# Patient Record
Sex: Female | Born: 1970
Health system: Southern US, Community
[De-identification: ages and names within clinical notes are randomized; demographics above are authoritative.]

## PROBLEM LIST (undated history)

## (undated) DIAGNOSIS — K219 Gastro-esophageal reflux disease without esophagitis: Secondary | ICD-10-CM

## (undated) HISTORY — DX: Gastro-esophageal reflux disease without esophagitis: K21.9

---

## 1999-07-19 ENCOUNTER — Encounter: Payer: Self-pay | Admitting: Obstetrics and Gynecology

## 1999-07-19 ENCOUNTER — Encounter: Admission: RE | Admit: 1999-07-19 | Discharge: 1999-07-19 | Payer: Self-pay | Admitting: Obstetrics and Gynecology

## 2001-09-06 ENCOUNTER — Other Ambulatory Visit: Admission: RE | Admit: 2001-09-06 | Discharge: 2001-09-06 | Payer: Self-pay | Admitting: Obstetrics and Gynecology

## 2005-06-16 HISTORY — PX: APPENDECTOMY: SHX54

## 2005-09-21 ENCOUNTER — Observation Stay (HOSPITAL_COMMUNITY): Admission: EM | Admit: 2005-09-21 | Discharge: 2005-09-22 | Payer: Self-pay | Admitting: Emergency Medicine

## 2005-09-21 ENCOUNTER — Encounter (INDEPENDENT_AMBULATORY_CARE_PROVIDER_SITE_OTHER): Payer: Self-pay | Admitting: *Deleted

## 2006-09-25 ENCOUNTER — Encounter: Admission: RE | Admit: 2006-09-25 | Discharge: 2006-09-25 | Payer: Self-pay | Admitting: Obstetrics and Gynecology

## 2009-07-31 ENCOUNTER — Encounter: Admission: RE | Admit: 2009-07-31 | Discharge: 2009-07-31 | Payer: Self-pay | Admitting: Obstetrics and Gynecology

## 2010-07-06 ENCOUNTER — Other Ambulatory Visit: Payer: Self-pay | Admitting: Obstetrics and Gynecology

## 2010-07-06 DIAGNOSIS — Z1239 Encounter for other screening for malignant neoplasm of breast: Secondary | ICD-10-CM

## 2010-08-01 ENCOUNTER — Ambulatory Visit
Admission: RE | Admit: 2010-08-01 | Discharge: 2010-08-01 | Disposition: A | Discharge status: Home / Self Care | Payer: Self-pay | Source: Ambulatory Visit | Attending: Obstetrics and Gynecology | Admitting: Obstetrics and Gynecology

## 2010-08-01 DIAGNOSIS — Z1239 Encounter for other screening for malignant neoplasm of breast: Secondary | ICD-10-CM

## 2010-11-01 NOTE — H&P (Signed)
Jo, Cohen                ACCOUNT NO.:  [id number]   MEDICAL RECORD NO.:  [id number]          PATIENT TYPE:  INP   LOCATION:  1825                         FACILITY:  [facility]   PHYSICIAN:  Sandria Bales. Ezzard Standing, M.D.  DATE OF BIRTH:  1970-07-27   DATE OF ADMISSION:  09/20/2005  DATE OF DISCHARGE:                                HISTORY & PHYSICAL   HISTORY OF PRESENT ILLNESS:  Jo Cohen is a 40 year old white female who is  a patient of Dr. Hilbert Bible who started having some abdominal burning on  Thursday,  September 18, 2005.  She then developed nausea and vomiting, and pain  that localized to her right lower quadrant.  She came to [facility]  emergency room last evening.  A CT scan done showed some edema of her  appendix which was suggested of early appendicitis.   Interestingly, Jo Cohen had a similar episode in December of 2006 where  she went to [facility].  At that time she was told she had an elevated white  blood count; however, CT scan done without contrast was negative, and she  went home and got better without any further symptoms until this episode.   She denies any history of peptic ulcer disease, liver disease, pancreatic  disease, or bowel disease.  Her only prior abdominal surgery was a C-  section.   ALLERGIES:  NO KNOWN DRUG ALLERGIES.   MEDICATIONS:  Birth control pills.   REVIEW OF SYSTEMS:  NEUROLOGIC:  No seizures or loss of consciousness.  PULMONARY:  She does not smoke cigarettes.  No history of  pneumonia,  tuberculosis.  CARDIAC:  No history of heart disease, chest pain, or hypertension.  GASTROINTESTINAL:  No history of peptic ulcer disease.  See history of  present illness.  UROLOGIC:  No history of kidney infections.   SOCIAL HISTORY:  She has her fiance in the room with her.  She is single,  but again, engaged to be married.  She has, I think, at least two children.  She is a Designer, industrial/product at [employer].   PHYSICAL  EXAMINATION:  VITAL SIGNS:  Temperature 97.2, blood pressure  121/77, pulse 92, respirations 18.  GENERAL:  She is well-nourished white female, alert and cooperative.  HEENT:  Unremarkable.  NECK:  Supple.  I feel no mass, no thyromegaly.  LUNGS:  Clear to auscultation.  HEART:  Regular rate and rhythm.  I hear no murmur or rub.  ABDOMEN:  She is mildly sore to moderately sore in her right lower quadrant.  She really has no peritoneal signs such as rebound or guarding.  EXTREMITIES:  She has good strength in all four extremities.  NEUROLOGIC:  Grossly intact.   LABORATORY DATA:  White blood count 6900, hemoglobin 12, hematocrit 35,  neutrophils 66%.  Sodium 140, potassium 3.9, chloride 108, CO2 29.  Her  urinalysis was negative.   A review of the CT shows what looks like maybe a thickened appendix.  This  thing is sort of presacral.  It actually probably lays towards the right  retroperitoneum.  She has no abscess.  She also may have the suggestion of  either a gallstone, polyp, or stone.   IMPRESSION:  1.  Probable appendicitis which is possibly recurrent.  Discussed with the      patient and her fiance appendectomy.  We discussed the indications and      potential complications.  The potential complications include but are      not limited to bleeding, infection, bowel injury, and the possibility of      open surgery.  2.  Possible gallstone on computed tomography scan.      Sandria Bales. Ezzard Standing, M.D.  Electronically Signed     DHN/MEDQ  D:  09/21/2005  T:  09/21/2005  Job:  [id number]   cc:   Malachi Pro. Ambrose Mantle, M.D.  Fax: [phone]

## 2010-11-01 NOTE — Op Note (Signed)
[name], Jo Cohen                ACCOUNT NO.:  [id number]   MEDICAL RECORD NO.:  [id number]          PATIENT TYPE:  INP   LOCATION:  2550                         FACILITY:  [facility]   PHYSICIAN:  Sandria Bales. Ezzard Standing, M.D.  DATE OF BIRTH:  [birth year]   DATE OF PROCEDURE:  09/21/2005  DATE OF DISCHARGE:                                 OPERATIVE REPORT   PREOPERATIVE DIAGNOSIS:  Appendicitis.   POSTOPERATIVE DIAGNOSIS:  Appendicitis.   PROCEDURE:  Laparoscopic appendectomy.   SURGEON:  Sandria Bales. Ezzard Standing, M.D.   FIRST ASSISTANT:  None.   ANESTHESIA:  General endotracheal with approximately 20 mL of 0.25%  Marcaine.   COMPLICATIONS:  None.   INDICATION FOR PROCEDURE:  [name] is a 40 year old white female who has  had a 2-plus-day history of abdominal burning, nausea and vomiting with pain  localized to the right lower quadrant.  A CT scan has suggested early  appendicitis.  She now comes for attempted laparoscopic appendectomy.   The indications and potential complications were explained to the patient  and her fiance, who is with her.  Her potential complications include, but  are not limited to, bleeding, infection, bowel injury and the need for open  surgery.   OPERATIVE NOTE:  The patient was placed in a supine position, given a  general anesthetic as supervised by Dr. Kipp Brood.  Her left arm was  tucked by her side, her right arm out.  Foley catheter was then placed.  Her  abdomen was prepped with Betadine solution and sterilely draped.   An infraumbilical incision was made with sharp dissection and carried down  to the abdominal cavity.  A 0-degree 12-mm Hasson trocar was inserted into  the abdominal cavity and secured with a 0 Vicryl suture.  I placed a 10-mm 0-  degree laparoscope and did a laparoscopic exploration.  The right and left  lobes of the liver were unremarkable.  Anterior wall of the stomach was  unremarkable.  Gallbladder was unremarkable.  I visualized  both the right  and left ovaries and tubes and they were unremarkable, but she did have  along her pelvic brim on the right lower quadrant an inflamed-looking  appendix.   The appendix was grabbed and mesentery taken down with a harmonic scalpel  down to the base of the appendix.  I then used the vascular Endo GIA 30-mm  stapler to fire across the base of the appendix and then placed the appendix  in the EndoCatch bag and delivered it through the umbilicus.   I reinspected the appendiceal stump, where it looked like the stapler had  worked well.  She had minimal contamination from the appendix.  There was no  other inflammatory mass.   Each trocar was then removed in turn with the umbilical trocar closed with a  0 Vicryl.  The skin at each site was closed with a 5-0 Vicryl suture,  painted with tincture of Benzoin and steri-stripped.   The patient tolerated the procedure well and was transported to the recovery  room in good condition.  Sponge and needle  counts were correct at the end of  the case.      Sandria Bales. Ezzard Standing, M.D.  Electronically Signed     DHN/MEDQ  D:  09/21/2005  T:  09/22/2005  Job:  [id number]   cc:   Malachi Pro. Ambrose Mantle, M.D.  Fax: [phone]

## 2011-07-21 ENCOUNTER — Other Ambulatory Visit: Payer: Self-pay | Admitting: Obstetrics and Gynecology

## 2011-07-21 DIAGNOSIS — Z1231 Encounter for screening mammogram for malignant neoplasm of breast: Secondary | ICD-10-CM

## 2011-08-04 ENCOUNTER — Ambulatory Visit: Payer: 59

## 2011-08-05 ENCOUNTER — Ambulatory Visit
Admission: RE | Admit: 2011-08-05 | Discharge: 2011-08-05 | Disposition: A | Discharge status: Home / Self Care | Payer: 59 | Source: Ambulatory Visit | Attending: Obstetrics and Gynecology | Admitting: Obstetrics and Gynecology

## 2011-08-05 DIAGNOSIS — Z1231 Encounter for screening mammogram for malignant neoplasm of breast: Secondary | ICD-10-CM

## 2011-09-22 ENCOUNTER — Telehealth (INDEPENDENT_AMBULATORY_CARE_PROVIDER_SITE_OTHER): Payer: Self-pay | Admitting: General Surgery

## 2011-09-22 NOTE — Telephone Encounter (Signed)
Pt reports she is pt of Dr. Ezzard Standing, and is pretty sure she has an anal fissure.  Her GYN told her to call for appt.  She has had pain and bleeding with BMs all weekend.  No appts until May.  Can she be worked into Dr. Allene Pyo schedule?  Please let her know.

## 2011-09-23 ENCOUNTER — Encounter (INDEPENDENT_AMBULATORY_CARE_PROVIDER_SITE_OTHER): Payer: Self-pay | Admitting: General Surgery

## 2011-09-23 ENCOUNTER — Ambulatory Visit (INDEPENDENT_AMBULATORY_CARE_PROVIDER_SITE_OTHER): Payer: 59 | Admitting: General Surgery

## 2011-09-23 VITALS — BP 96/64 | HR 82 | Temp 97.8°F | Resp 18 | Ht 66.0 in | Wt 130.5 lb

## 2011-09-23 DIAGNOSIS — K645 Perianal venous thrombosis: Secondary | ICD-10-CM

## 2011-09-23 NOTE — Progress Notes (Signed)
Patient ID: Jo Cohen, female   DOB: 1970-11-12, 41 y.o.   MRN: [id number]  Chief Complaint  Patient presents with  . Follow-up    Possible hemorrhoids    HPI Jo Cohen is a 41 y.o. female.  She is referred by Dr. Tracey [name] for rectal problems.  The patient has not had any rectal problems in the past. For the past 5 or 6 days she has noticed a painful swelling in the perianal area. Last week she looked in the mirror and saw a purple grape sized swelling. She says this has gotten much better. She sees a little bit of bleeding but she has a bowel movement. She denies any recent constipation or diarrhea. She says it is not painful to have a bowel movement. She's had no prior problems. HPI  Past Medical History  Diagnosis Date  . Hemorrhoids     Past Surgical History  Procedure Date  . Cesarean section 1998  . Appendectomy 2007    History reviewed. No pertinent family history.  Social History History  Substance Use Topics  . Smoking status: Never Smoker   . Smokeless tobacco: Never Used  . Alcohol Use: Yes     1 or 2 per month    No Known Allergies  Current Outpatient Prescriptions  Medication Sig Dispense Refill  . Calcium Carbonate-Vitamin D (CALCIUM + D PO) Take by mouth daily.      . fish oil-omega-3 fatty acids 1000 MG capsule Take 2 g by mouth daily.      . Multiple Vitamin (MULTIVITAMIN) tablet Take 1 tablet by mouth daily.        Review of Systems Review of Systems  Constitutional: Negative.  Negative for fever, chills and unexpected weight change.  HENT: Negative.  Negative for hearing loss, congestion, sore throat, trouble swallowing and voice change.   Eyes: Negative for visual disturbance.  Respiratory: Negative for cough and wheezing.   Cardiovascular: Negative.  Negative for chest pain, palpitations and leg swelling.  Gastrointestinal: Negative.  Negative for nausea, vomiting, abdominal pain, diarrhea, constipation, blood in stool, abdominal  distention and anal bleeding.  Genitourinary: Negative.  Negative for hematuria, vaginal bleeding and difficulty urinating.  Musculoskeletal: Negative for arthralgias.  Skin: Negative for rash and wound.  Neurological: Negative.  Negative for seizures, syncope and headaches.  Hematological: Negative.  Negative for adenopathy. Does not bruise/bleed easily.  Psychiatric/Behavioral: Negative.  Negative for confusion.    Blood pressure 96/64, pulse 82, temperature 97.8 F (36.6 C), temperature source Temporal, resp. rate 18, height 5\' 6"  (1.676 m), weight 130 lb 8 oz (59.194 kg).  Physical Exam Physical Exam  Constitutional: She is oriented to person, place, and time. She appears well-developed and well-nourished. No distress.  Abdominal: Soft. Bowel sounds are normal. She exhibits no distension and no mass. There is no tenderness. There is no rebound and no guarding.  Genitourinary:       She has a tiny thrombosed external hemorrhoid posteriorly. There was a slightly tiny slit of open tissue inside but not much pain. No infection. No fistula. Digital exam reveals almost no pain normal sphincter tone no internal mass. No blood.  Musculoskeletal: Normal range of motion. She exhibits no edema.  Neurological: She is alert and oriented to person, place, and time. She exhibits normal muscle tone. Coordination normal.  Skin: Skin is warm and dry. No rash noted. She is not diaphoretic. No erythema. No pallor.  Psychiatric: She has a normal mood and affect.  Her behavior is normal. Judgment and thought content normal.    Data Reviewed   Assessment    Thrombosed external hemorrhoid, posterior, rapidly resolving. No surgical intervention necessary.  Question associated acute fissure posteriorly. Anticipate spontaneous healing with conservative care.    Plan    Forced hydration, high fiber diet, Metamucil b.i.d.  Analpram-HC 2.5% cream, prescription given. Using internally and externally  b.i.d.  Patient instructed that symptoms should markedly improve within a week or 2 and should resolve within one month.  Return p.r.n.       Angelia Mould. Derrell Lolling, M.D., [facility] General and Minimally invasive Surgery Breast and Colorectal Surgery Office:   [phone] Pager:   [phone]  09/23/2011, 3:37 PM

## 2011-09-23 NOTE — Patient Instructions (Signed)
You have a small thrombosed external hemorrhoid that is resolving on its own. I do not think that you will need surgery for this. There also may be a tiny anal fissure.  I advise a high fiber, low fat diet, a dose of Metamucil twice a day, and forced hydration to keep your stools soft.  Used the steroid cream that I gave you twice a day and put a little bit inside and a little bit on the outside.  You should feel much better in 2 weeks and you should have no symptoms in one month.  Return to see [place] if any further problems arise.

## 2011-10-27 ENCOUNTER — Encounter: Payer: Self-pay | Admitting: Family Medicine

## 2011-10-27 ENCOUNTER — Ambulatory Visit (INDEPENDENT_AMBULATORY_CARE_PROVIDER_SITE_OTHER): Payer: 59 | Admitting: Family Medicine

## 2011-10-27 VITALS — BP 110/72 | HR 86 | Temp 98.4°F | Ht 66.0 in | Wt 133.8 lb

## 2011-10-27 DIAGNOSIS — R5383 Other fatigue: Secondary | ICD-10-CM

## 2011-10-27 DIAGNOSIS — R5381 Other malaise: Secondary | ICD-10-CM

## 2011-10-27 DIAGNOSIS — Z0000 Encounter for general adult medical examination without abnormal findings: Secondary | ICD-10-CM

## 2011-10-27 LAB — CBC WITH DIFFERENTIAL/PLATELET
Basophils Absolute: 0.1 10*3/uL (ref 0.0–0.1)
Basophils Relative: 1.3 % (ref 0.0–3.0)
Eosinophils Absolute: 0.2 10*3/uL (ref 0.0–0.7)
Eosinophils Relative: 3.9 % (ref 0.0–5.0)
HCT: 40.8 % (ref 36.0–46.0)
Hemoglobin: 13.6 g/dL (ref 12.0–15.0)
Lymphocytes Relative: 21.6 % (ref 12.0–46.0)
Lymphs Abs: 1.2 10*3/uL (ref 0.7–4.0)
MCHC: 33.3 g/dL (ref 30.0–36.0)
MCV: 96.7 fl (ref 78.0–100.0)
Monocytes Absolute: 0.6 10*3/uL (ref 0.1–1.0)
Monocytes Relative: 11.3 % (ref 3.0–12.0)
Neutro Abs: 3.6 10*3/uL (ref 1.4–7.7)
Neutrophils Relative %: 61.9 % (ref 43.0–77.0)
Platelets: 243 10*3/uL (ref 150.0–400.0)
RBC: 4.21 Mil/uL (ref 3.87–5.11)
RDW: 13.1 % (ref 11.5–14.6)
WBC: 5.7 10*3/uL (ref 4.5–10.5)

## 2011-10-27 LAB — BASIC METABOLIC PANEL
BUN: 14 mg/dL (ref 6–23)
CO2: 25 mEq/L (ref 19–32)
Calcium: 8.5 mg/dL (ref 8.4–10.5)
Chloride: 99 mEq/L (ref 96–112)
Creatinine, Ser: 0.6 mg/dL (ref 0.4–1.2)
GFR: 110 mL/min (ref >60.00)
Glucose, Bld: 84 mg/dL (ref 70–99)
Potassium: 4.1 mEq/L (ref 3.5–5.1)
Sodium: 137 mEq/L (ref 135–145)

## 2011-10-27 LAB — HEPATIC FUNCTION PANEL
ALT: 18 U/L (ref 0–35)
AST: 21 U/L (ref 0–37)
Albumin: 3.9 g/dL (ref 3.5–5.2)
Alkaline Phosphatase: 61 U/L (ref 39–117)
Bilirubin, Direct: 0 mg/dL (ref 0.0–0.3)
Total Bilirubin: 0.5 mg/dL (ref 0.3–1.2)
Total Protein: 7.1 g/dL (ref 6.0–8.3)

## 2011-10-27 LAB — LDL CHOLESTEROL, DIRECT: Direct LDL: 98.9 mg/dL

## 2011-10-27 LAB — TSH: TSH: 0.82 u[IU]/mL (ref 0.35–5.50)

## 2011-10-27 NOTE — Progress Notes (Signed)
Patient Name: Jo Cohen Date of Birth: 04/10/1971 Age: 41 y.o. Medical Record Number: [id number] Gender: female Date of Encounter: 10/27/2011  History of Present Illness:  Jo Cohen is a 41 y.o. very pleasant female patient who presents with the following:  CPX: 2 drinks a month Walks 45 mins a day. Weights 3 x / week.  Health Maintenance Summary Reviewed and updated, unless pt declines services.  Tobacco History Reviewed. Non-smoker Alcohol: No concerns, no excessive use (2 x / month) Exercise Habits: Some activity, rec at least 30 mins 5 times a week - every day now STD concerns: none Drug Use: None Lumps or breast concerns: no breast exam done at Dr. Ambrose Mantle office [facility]) Breast Cancer Family History: no  There is no problem list on file for this patient.  Past Medical History  Diagnosis Date  . Hemorrhoids    Past Surgical History  Procedure Date  . Cesarean section 1998  . Appendectomy 2007   History  Substance Use Topics  . Smoking status: Never Smoker   . Smokeless tobacco: Never Used  . Alcohol Use: Yes     1 or 2 per month   No family history on file. No Known Allergies Current Outpatient Prescriptions on File Prior to Visit  Medication Sig Dispense Refill  . Calcium Carbonate-Vitamin D (CALCIUM + D PO) Take by mouth daily.      . fish oil-omega-3 fatty acids 1000 MG capsule Take 2 g by mouth daily.      . Multiple Vitamin (MULTIVITAMIN) tablet Take 1 tablet by mouth daily.         Past Medical History, Surgical History, Social History, Family History, Problem List, Medications, and Allergies have been reviewed and updated if relevant.  Review of Systems:  General: Denies fever, chills, sweats. No significant weight loss. Eyes: Denies blurring,significant itching ENT: Denies earache, sore throat, and hoarseness.  Cardiovascular: Denies chest pains, palpitations, dyspnea on exertion,  Respiratory: Denies cough, dyspnea at  rest,wheeezing GI: Denies nausea, vomiting, diarrhea, constipation, change in bowel habits, abdominal pain, melena, hematochezia GU: Denies dysuria, hematuria, urinary hesitancy, nocturia, denies STD risk, no concerns about discharge Musculoskeletal: Denies back pain, joint pain Derm: Denies rash, itching Neuro: Denies  paresthesias, frequent falls, frequent headaches Psych: Denies depression, anxiety Endocrine: Denies cold intolerance, heat intolerance, polydipsia Heme: Denies enlarged lymph nodes Allergy: No hayfever   Physical Examination: Filed Vitals:   10/27/11 1352  BP: 110/72  Pulse: 86  Temp: 98.4 F (36.9 C)  TempSrc: Oral  Height: 5\' 6"  (1.676 m)  Weight: 133 lb 12.8 oz (60.691 kg)  SpO2: 99%    Body mass index is 21.60 kg/(m^2).   GEN: well developed, well nourished, no acute distress Eyes: conjunctiva and lids normal, PERRLA, EOMI ENT: TM clear, nares clear, oral exam WNL Neck: supple, no lymphadenopathy, no thyromegaly, no JVD Pulm: clear to auscultation and percussion, respiratory effort normal CV: regular rate and rhythm, S1-S2, no murmur, rub or gallop, no bruits Chest: no scars, masses, no lumps BREAST: breast exam declined GI: soft, non-tender; no hepatosplenomegaly, masses; active bowel sounds all quadrants GU: GU exam declined Lymph: no cervical, axillary or inguinal adenopathy MSK: gait normal, muscle tone and strength WNL, no joint swelling, effusions, discoloration, crepitus  SKIN: clear, good turgor, color WNL, no rashes, lesions, or ulcerations Neuro: normal mental status, normal strength, sensation, and motion Psych: alert; oriented to person, place and time, normally interactive and not anxious or depressed in appearance.  Assessment and Plan:  1. Routine general medical examination at a health care facility  Basic metabolic panel, CBC with Differential, Hepatic function panel, LDL cholesterol, direct, TSH  2. Other malaise and fatigue   TSH   The patient's preventative maintenance and recommended screening tests for an annual wellness exam were reviewed in full today. Brought up to date unless services declined.  Counselled on the importance of diet, exercise, and its role in overall health and mortality. The patient's FH and SH was reviewed, including their home life, tobacco status, and drug and alcohol status.   Doing great. No concerns. Keep up regular exercise.   Orders Today: Orders Placed This Encounter  Procedures  . Basic metabolic panel  . CBC with Differential  . Hepatic function panel  . LDL cholesterol, direct  . TSH    Medications Today: Meds ordered this encounter  Medications  . hydrocortisone-pramoxine (ANALPRAM-HC) 2.5-1 % rectal cream    Sig:

## 2011-10-27 NOTE — Patient Instructions (Signed)
F/u 1 year or as needed

## 2011-10-28 ENCOUNTER — Encounter: Payer: Self-pay | Admitting: *Deleted

## 2012-03-18 ENCOUNTER — Ambulatory Visit (INDEPENDENT_AMBULATORY_CARE_PROVIDER_SITE_OTHER): Payer: 59 | Admitting: Family Medicine

## 2012-03-18 ENCOUNTER — Encounter: Payer: Self-pay | Admitting: Family Medicine

## 2012-03-18 VITALS — BP 109/73 | HR 99 | Temp 98.2°F | Ht 67.0 in | Wt 136.0 lb

## 2012-03-18 DIAGNOSIS — J069 Acute upper respiratory infection, unspecified: Secondary | ICD-10-CM

## 2012-03-18 DIAGNOSIS — J329 Chronic sinusitis, unspecified: Secondary | ICD-10-CM

## 2012-03-18 MED ORDER — AMOXICILLIN 500 MG PO CAPS: 1000.0000 mg | ORAL_CAPSULE | Freq: Two times a day (BID) | ORAL | Status: DC

## 2012-03-18 NOTE — Progress Notes (Signed)
Nature conservation officer at [facility] [address] [place] [address] Phone: [phone] Fax: [phone]  Date:  03/18/2012   Name:  Jo Cohen   DOB:  [birth year]   MRN:  [id number] Gender: female Age: 41 y.o.  PCP:  Hannah Beat, MD    Chief Complaint: Sinus Problem   History of Present Illness:  Jo Cohen is a 42 y.o. pleasant patient who presents with the following:  Either, allergies, sinus or cold. Plans to go to concert  5 days. Some achiness.   Patient who's been sick for about 5 days with some nasal congestion, some coughing, some sinus pressure some ear fullness. She has not had a fever. She has not really had any significant chills. She does have some allergies at baseline. Some achiness throughout. No focal rashes. No nausea, vomiting, diarrhea, constipation or other GI symptoms.  There is no problem list on file for this patient.   Past Medical History  Diagnosis Date  . Hemorrhoids     Past Surgical History  Procedure Date  . Cesarean section 1998  . Appendectomy 2007    History  Substance Use Topics  . Smoking status: Never Smoker   . Smokeless tobacco: Never Used  . Alcohol Use: Yes     1 or 2 per month    No family history on file.  No Known Allergies  Medication list has been reviewed and updated.  Outpatient Prescriptions Prior to Visit  Medication Sig Dispense Refill  . Calcium Carbonate-Vitamin D (CALCIUM + D PO) Take by mouth daily.      . fish oil-omega-3 fatty acids 1000 MG capsule Take 2 g by mouth daily.      . Multiple Vitamin (MULTIVITAMIN) tablet Take 1 tablet by mouth daily.      . hydrocortisone-pramoxine (ANALPRAM-HC) 2.5-1 % rectal cream         Review of Systems:  ROS: GEN: Acute illness details above GI: Tolerating PO intake GU: maintaining adequate hydration and urination Pulm: No SOB Interactive and getting along well at home.  Otherwise, ROS is as per the HPI. She has taken some NyQuil at  nighttime, which is helped.  Physical Examination: Filed Vitals:   03/18/12 1552  BP: 109/73  Pulse: 99  Temp: 98.2 F (36.8 C)   Filed Vitals:   03/18/12 1552  Height: 5\' 7"  (1.702 m)  Weight: 136 lb (61.689 kg)   Body mass index is 21.30 kg/(m^2). Ideal Body Weight: Weight in (lb) to have BMI = 25: 159.3    Gen: WDWN, NAD; A & O x3, cooperative. Pleasant.Globally Non-toxic HEENT: Normocephalic and atraumatic. Throat clear, w/o exudate, R TM clear, L TM - good landmarks, No fluid present. rhinnorhea.  MMM Frontal sinuses: NT Max sinuses: NT NECK: Anterior cervical  LAD is absent CV: RRR, No M/G/R, cap refill <2 sec PULM: Breathing comfortably in no respiratory distress. no wheezing, crackles, rhonchi EXT: No c/c/e PSYCH: Friendly, good eye contact MSK: Nml gait    Assessment and Plan:  URI versus developing sinus infection. At this point, recommended supportive care, and gave her a written prescription for amoxicillin. If she is still worsening through the weekend, then advised her to fill ABX. Recommended DayQuil during the day and NyQuil at night.  Orders Today:  No orders of the defined types were placed in this encounter.    Updated Medication List: (Includes new medications, updates to list, dose adjustments) Meds ordered this encounter  Medications  .  amoxicillin (AMOXIL) 500 MG capsule    Sig: Take 2 capsules (1,000 mg total) by mouth 2 (two) times daily.    Dispense:  40 capsule    Refill:  0    Medications Discontinued: There are no discontinued medications.   Hannah Beat, MD

## 2012-07-08 ENCOUNTER — Other Ambulatory Visit: Payer: Self-pay | Admitting: Obstetrics and Gynecology

## 2012-07-08 DIAGNOSIS — Z1231 Encounter for screening mammogram for malignant neoplasm of breast: Secondary | ICD-10-CM

## 2012-08-05 ENCOUNTER — Ambulatory Visit
Admission: RE | Admit: 2012-08-05 | Discharge: 2012-08-05 | Disposition: A | Discharge status: Home / Self Care | Payer: 59 | Source: Ambulatory Visit | Attending: Obstetrics and Gynecology | Admitting: Obstetrics and Gynecology

## 2012-08-05 DIAGNOSIS — Z1231 Encounter for screening mammogram for malignant neoplasm of breast: Secondary | ICD-10-CM

## 2012-08-06 ENCOUNTER — Other Ambulatory Visit: Payer: Self-pay | Admitting: Obstetrics and Gynecology

## 2012-08-06 DIAGNOSIS — R928 Other abnormal and inconclusive findings on diagnostic imaging of breast: Secondary | ICD-10-CM

## 2012-08-18 ENCOUNTER — Ambulatory Visit
Admission: RE | Admit: 2012-08-18 | Discharge: 2012-08-18 | Disposition: A | Discharge status: Home / Self Care | Payer: 59 | Source: Ambulatory Visit | Attending: Obstetrics and Gynecology | Admitting: Obstetrics and Gynecology

## 2012-08-18 DIAGNOSIS — R928 Other abnormal and inconclusive findings on diagnostic imaging of breast: Secondary | ICD-10-CM

## 2012-10-27 ENCOUNTER — Encounter: Payer: 59 | Admitting: Family Medicine

## 2012-11-03 ENCOUNTER — Encounter: Payer: Self-pay | Admitting: Family Medicine

## 2012-11-03 ENCOUNTER — Ambulatory Visit (INDEPENDENT_AMBULATORY_CARE_PROVIDER_SITE_OTHER): Payer: 59 | Admitting: Family Medicine

## 2012-11-03 VITALS — BP 100/60 | HR 70 | Temp 98.0°F | Ht 67.0 in | Wt 133.5 lb

## 2012-11-03 DIAGNOSIS — R5381 Other malaise: Secondary | ICD-10-CM

## 2012-11-03 DIAGNOSIS — Z0000 Encounter for general adult medical examination without abnormal findings: Secondary | ICD-10-CM

## 2012-11-03 DIAGNOSIS — Z131 Encounter for screening for diabetes mellitus: Secondary | ICD-10-CM

## 2012-11-03 DIAGNOSIS — Z13220 Encounter for screening for lipoid disorders: Secondary | ICD-10-CM

## 2012-11-03 LAB — HEPATIC FUNCTION PANEL
ALT: 18 U/L (ref 0–35)
AST: 22 U/L (ref 0–37)
Albumin: 4.1 g/dL (ref 3.5–5.2)
Alkaline Phosphatase: 46 U/L (ref 39–117)
Bilirubin, Direct: 0.1 mg/dL (ref 0.0–0.3)
Total Bilirubin: 1 mg/dL (ref 0.3–1.2)
Total Protein: 7.2 g/dL (ref 6.0–8.3)

## 2012-11-03 LAB — CBC WITH DIFFERENTIAL/PLATELET
Basophils Absolute: 0 10*3/uL (ref 0.0–0.1)
Basophils Relative: 0.8 % (ref 0.0–3.0)
Eosinophils Absolute: 0.2 10*3/uL (ref 0.0–0.7)
Eosinophils Relative: 3 % (ref 0.0–5.0)
HCT: 43.3 % (ref 36.0–46.0)
Hemoglobin: 14.6 g/dL (ref 12.0–15.0)
Lymphocytes Relative: 21.4 % (ref 12.0–46.0)
Lymphs Abs: 1.2 10*3/uL (ref 0.7–4.0)
MCHC: 33.7 g/dL (ref 30.0–36.0)
MCV: 94.1 fl (ref 78.0–100.0)
Monocytes Absolute: 0.7 10*3/uL (ref 0.1–1.0)
Monocytes Relative: 12.1 % — ABNORMAL HIGH (ref 3.0–12.0)
Neutro Abs: 3.5 10*3/uL (ref 1.4–7.7)
Neutrophils Relative %: 62.7 % (ref 43.0–77.0)
Platelets: 223 10*3/uL (ref 150.0–400.0)
RBC: 4.59 Mil/uL (ref 3.87–5.11)
RDW: 13.1 % (ref 11.5–14.6)
WBC: 5.5 10*3/uL (ref 4.5–10.5)

## 2012-11-03 LAB — LIPID PANEL
Cholesterol: 207 mg/dL — ABNORMAL HIGH (ref 0–200)
HDL: 72.3 mg/dL (ref >39.00)
Total CHOL/HDL Ratio: 3
Triglycerides: 55 mg/dL (ref 0.0–149.0)
VLDL: 11 mg/dL (ref 0.0–40.0)

## 2012-11-03 LAB — BASIC METABOLIC PANEL
BUN: 14 mg/dL (ref 6–23)
CO2: 25 mEq/L (ref 19–32)
Calcium: 9.1 mg/dL (ref 8.4–10.5)
Chloride: 104 mEq/L (ref 96–112)
Creatinine, Ser: 0.8 mg/dL (ref 0.4–1.2)
GFR: 83.09 mL/min (ref >60.00)
Glucose, Bld: 89 mg/dL (ref 70–99)
Potassium: 4.5 mEq/L (ref 3.5–5.1)
Sodium: 137 mEq/L (ref 135–145)

## 2012-11-03 LAB — TSH: TSH: 1.18 u[IU]/mL (ref 0.35–5.50)

## 2012-11-03 LAB — LDL CHOLESTEROL, DIRECT: Direct LDL: 113.7 mg/dL

## 2012-11-03 NOTE — Progress Notes (Signed)
Nature conservation officer at [facility] [address] [place] [address] Phone: [phone] Fax: [phone]  Date:  11/03/2012   Name:  Jo Cohen   DOB:  09-Jul-1970   MRN:  [id number] Gender: female Age: 42 y.o.  Primary Physician:  Hannah Beat, MD  Evaluating MD: Hannah Beat, MD   Chief Complaint: Annual Exam   History of Present Illness:  Jo Cohen is a 42 y.o. pleasant patient who presents with the following:  CPX:   No colon.  Karlyn Agee is dermatologist. Dr. Ambrose Mantle is gynecologist  Health Maintenance Summary Reviewed and updated, unless pt declines services.  Tobacco History Reviewed. Non-smoker Alcohol: No concerns, no excessive use Exercise Habits: 30 min a day, then full workout 3 times a week STD concerns: none Drug Use: None Menses regular: yes Lumps or breast concerns: no Breast Cancer Family History: no  Health Maintenance  Topic Date Due  . Pap Smear  07/22/1987  . Tetanus/tdap  07/21/1988  . Influenza Vaccine  02/14/2013    Labs reviewed with the patient.  Results for orders placed in visit on 10/27/11  BASIC METABOLIC PANEL      Result Value Range   Sodium 137  135 - 145 mEq/L   Potassium 4.1  3.5 - 5.1 mEq/L   Chloride 99  96 - 112 mEq/L   CO2 25  19 - 32 mEq/L   Glucose, Bld 84  70 - 99 mg/dL   BUN 14  6 - 23 mg/dL   Creatinine, Ser 0.6  0.4 - 1.2 mg/dL   Calcium 8.5  8.4 - 86.5 mg/dL   GFR 784.69  >62.95 mL/min  CBC WITH DIFFERENTIAL      Result Value Range   WBC 5.7  4.5 - 10.5 K/uL   RBC 4.21  3.87 - 5.11 Mil/uL   Hemoglobin 13.6  12.0 - 15.0 g/dL   HCT 28.4  13.2 - 44.0 %   MCV 96.7  78.0 - 100.0 fl   MCHC 33.3  30.0 - 36.0 g/dL   RDW 10.2  72.5 - 36.6 %   Platelets 243.0  150.0 - 400.0 K/uL   Neutrophils Relative % 61.9  43.0 - 77.0 %   Lymphocytes Relative 21.6  12.0 - 46.0 %   Monocytes Relative 11.3  3.0 - 12.0 %   Eosinophils Relative 3.9  0.0 - 5.0 %   Basophils Relative 1.3  0.0 - 3.0 %   Neutro Abs 3.6  1.4 - 7.7 K/uL   Lymphs Abs 1.2  0.7 - 4.0 K/uL   Monocytes Absolute 0.6  0.1 - 1.0 K/uL   Eosinophils Absolute 0.2  0.0 - 0.7 K/uL   Basophils Absolute 0.1  0.0 - 0.1 K/uL  HEPATIC FUNCTION PANEL      Result Value Range   Total Bilirubin 0.5  0.3 - 1.2 mg/dL   Bilirubin, Direct 0.0  0.0 - 0.3 mg/dL   Alkaline Phosphatase 61  39 - 117 U/L   AST 21  0 - 37 U/L   ALT 18  0 - 35 U/L   Total Protein 7.1  6.0 - 8.3 g/dL   Albumin 3.9  3.5 - 5.2 g/dL  LDL CHOLESTEROL, DIRECT      Result Value Range   Direct LDL 98.9    TSH      Result Value Range   TSH 0.82  0.35 - 5.50 uIU/mL     There are no active problems to display  for this patient.   Past Medical History  Diagnosis Date  . Hemorrhoids     Past Surgical History  Procedure Laterality Date  . Cesarean section  1998  . Appendectomy  2007    History   Social History  . Marital Status: Divorced    Spouse Name: N/A    Number of Children: N/A  . Years of Education: N/A   Occupational History  . Not on file.   Social History Main Topics  . Smoking status: Never Smoker   . Smokeless tobacco: Never Used  . Alcohol Use: Yes     Comment: 1 or 2 per month  . Drug Use: No  . Sexually Active: Not on file   Other Topics Concern  . Not on file   Social History Narrative  . No narrative on file    No family history on file.  No Known Allergies  Medication list has been reviewed and updated.  Outpatient Prescriptions Prior to Visit  Medication Sig Dispense Refill  . Calcium Carbonate-Vitamin D (CALCIUM + D PO) Take by mouth daily.      . fish oil-omega-3 fatty acids 1000 MG capsule Take 2 g by mouth daily.      . Multiple Vitamin (MULTIVITAMIN) tablet Take 1 tablet by mouth daily.      . hydrocortisone-pramoxine (ANALPRAM-HC) 2.5-1 % rectal cream       . amoxicillin (AMOXIL) 500 MG capsule Take 2 capsules (1,000 mg total) by mouth 2 (two) times daily.  40 capsule  0   No facility-administered  medications prior to visit.    Review of Systems:   General: Denies fever, chills, sweats. No significant weight loss. Eyes: Denies blurring,significant itching ENT: Denies earache, sore throat, and hoarseness.  Cardiovascular: Denies chest pains, palpitations, dyspnea on exertion,  Respiratory: Denies cough, dyspnea at rest,wheeezing Breast: no concerns about lumps GI: Denies nausea, vomiting, diarrhea, constipation, change in bowel habits, abdominal pain, melena, hematochezia GU: Denies dysuria, hematuria, urinary hesitancy, nocturia, denies STD risk, no concerns about discharge Musculoskeletal: Denies back pain, joint pain Derm: Denies rash, itching Neuro: Denies  paresthesias, frequent falls, frequent headaches Psych: Denies depression, anxiety Endocrine: Denies cold intolerance, heat intolerance, polydipsia Heme: Denies enlarged lymph nodes Allergy: No hayfever  Physical Examination: BP 100/60  Pulse 70  Temp(Src) 98 F (36.7 C) (Oral)  Ht 5\' 7"  (1.702 m)  Wt 133 lb 8 oz (60.555 kg)  BMI 20.9 kg/m2  SpO2 99%  Ideal Body Weight: Weight in (lb) to have BMI = 25: 159.3   GEN: well developed, well nourished, no acute distress Eyes: conjunctiva and lids normal, PERRLA, EOMI ENT: TM clear, nares clear, oral exam WNL Neck: supple, no lymphadenopathy, no thyromegaly, no JVD Pulm: clear to auscultation and percussion, respiratory effort normal CV: regular rate and rhythm, S1-S2, no murmur, rub or gallop, no bruits Chest: no scars, masses, no lumps BREAST: breast exam declined GI: soft, non-tender; no hepatosplenomegaly, masses; active bowel sounds all quadrants GU: GU exam declined Lymph: no cervical, axillary or inguinal adenopathy MSK: gait normal, muscle tone and strength WNL, no joint swelling, effusions, discoloration, crepitus  SKIN: clear, good turgor, color WNL, no rashes, lesions, or ulcerations Neuro: normal mental status, normal strength, sensation, and  motion Psych: alert; oriented to person, place and time, normally interactive and not anxious or depressed in appearance.   Assessment and Plan: Routine general medical examination at a health care facility  Screening for lipoid disorders - Plan: Lipid panel  Screening for diabetes mellitus - Plan: Basic metabolic panel  Other malaise and fatigue - Plan: CBC with Differential, TSH, Hepatic function panel   The patient's preventative maintenance and recommended screening tests for an annual wellness exam were reviewed in full today. Brought up to date unless services declined.  Counselled on the importance of diet, exercise, and its role in overall health and mortality. The patient's FH and SH was reviewed, including their home life, tobacco status, and drug and alcohol status.   Doing great Recent diag mammo, u/s, pap  Signed, Endia Moncur T. Shaliyah Taite, MD 11/03/2012 8:44 AM

## 2012-11-04 ENCOUNTER — Encounter: Payer: Self-pay | Admitting: *Deleted

## 2013-04-21 ENCOUNTER — Other Ambulatory Visit: Payer: Self-pay

## 2013-07-19 ENCOUNTER — Other Ambulatory Visit: Payer: Self-pay

## 2013-07-19 DIAGNOSIS — Z1231 Encounter for screening mammogram for malignant neoplasm of breast: Secondary | ICD-10-CM

## 2013-08-22 ENCOUNTER — Ambulatory Visit
Admission: RE | Admit: 2013-08-22 | Discharge: 2013-08-22 | Disposition: A | Discharge status: Home / Self Care | Payer: BC Managed Care – PPO | Source: Ambulatory Visit

## 2013-08-22 DIAGNOSIS — Z1231 Encounter for screening mammogram for malignant neoplasm of breast: Secondary | ICD-10-CM

## 2013-08-23 ENCOUNTER — Other Ambulatory Visit: Payer: Self-pay | Admitting: Obstetrics and Gynecology

## 2013-08-23 DIAGNOSIS — R928 Other abnormal and inconclusive findings on diagnostic imaging of breast: Secondary | ICD-10-CM

## 2013-08-29 ENCOUNTER — Ambulatory Visit
Admission: RE | Admit: 2013-08-29 | Discharge: 2013-08-29 | Disposition: A | Discharge status: Home / Self Care | Payer: BC Managed Care – PPO | Source: Ambulatory Visit | Attending: Obstetrics and Gynecology | Admitting: Obstetrics and Gynecology

## 2013-08-29 DIAGNOSIS — R928 Other abnormal and inconclusive findings on diagnostic imaging of breast: Secondary | ICD-10-CM

## 2014-01-05 ENCOUNTER — Encounter: Payer: Self-pay | Admitting: Family Medicine

## 2014-01-05 ENCOUNTER — Ambulatory Visit (INDEPENDENT_AMBULATORY_CARE_PROVIDER_SITE_OTHER): Payer: BC Managed Care – PPO | Admitting: Family Medicine

## 2014-01-05 VITALS — BP 101/63 | HR 68 | Temp 98.2°F | Ht 66.54 in | Wt 130.0 lb

## 2014-01-05 DIAGNOSIS — G2581 Restless legs syndrome: Secondary | ICD-10-CM

## 2014-01-05 DIAGNOSIS — M7711 Lateral epicondylitis, right elbow: Secondary | ICD-10-CM

## 2014-01-05 DIAGNOSIS — M7710 Lateral epicondylitis, unspecified elbow: Secondary | ICD-10-CM

## 2014-01-05 DIAGNOSIS — T148XXA Other injury of unspecified body region, initial encounter: Secondary | ICD-10-CM

## 2014-01-05 NOTE — Progress Notes (Signed)
Pre visit review using our clinic review tool, if applicable. No additional management support is needed unless otherwise documented below in the visit note. 

## 2014-01-05 NOTE — Progress Notes (Signed)
[address] [place] [address] Phone: [phone] Fax: [phone]  Patient ID: Jo AlcideSusan S Bartles MRN: [id number], DOB: 03/14/1971, 43 y.o. Date of Encounter: 01/05/2014  Primary Physician:  Hannah BeatSpencer Lexani Corona, MD   Chief Complaint: Restless Legs and Unexplained Bruising   Subjective:   History of Present Illness:  Jo AlcideSusan S Seubert is a 43 y.o. very pleasant female patient who presents with the following:  In the evenings, cannot and in sleep. Only in the evening. Only in the left one and it feels different all the time. Bruising has been several years. Hurting and bruising - no cramping. Husband notes that she does this all the time during sleep L >> R.  Got married last year.   R elbow: pain at LE, ECRB. Pain with lifting palm down, thumb up.  Past Medical History, Surgical History, Social History, Family History, Problem List, Medications, and Allergies have been reviewed and updated if relevant.  Review of Systems:  GEN: No acute illnesses, no fevers, chills. GI: No n/v/d, eating normally Pulm: No SOB Interactive and getting along well at home.  Otherwise, ROS is as per the HPI.  Objective:   Physical Examination: BP 101/63  Pulse 68  Temp(Src) 98.2 F (36.8 C) (Oral)  Ht 5' 6.53" (1.69 m)  Wt 130 lb (58.968 kg)  BMI 20.65 kg/m2  LMP 12/19/2013   GEN: WDWN, NAD, Non-toxic, A & O x 3 HEENT: Atraumatic, Normocephalic. Neck supple. No masses, No LAD. Ears and Nose: No external deformity. CV: RRR, No M/G/R. No JVD. No thrill. No extra heart sounds. PULM: CTA B, no wheezes, crackles, rhonchi. No retractions. No resp. distress. No accessory muscle use. EXTR: No c/c/e, rare bruising NEURO Normal gait.  PSYCH: Normally interactive. Conversant. Not depressed or anxious appearing.  Calm demeanor.   Elbow: R Ecchymosis or edema: neg ROM: full flexion, extension, pronation, supination Shoulder ROM: Full Flexion: 5/5 Extension: 5/5, PAINFUL Supination: 5/5,  PAINFUL Pronation: 5/5 Wrist ext: 5/5 Wrist flexion: 5/5 No gross bony abnormality Varus and Valgus stress: stable ECRB tenderness: YES, TTP Medial epicondyle: NT Lateral epicondyle, resisted wrist extension from wrist full pronation and flexion: PAINFUL grip: 5/5  sensation intact Tinel's, Elbow: negative    Laboratory and Imaging Data:  Assessment & Plan:   Restless legs syndrome (RLS) - Plan: CBC with Differential, Ferritin  Bruising - Plan: CBC with Differential  Lateral epicondylitis (tennis elbow), right  Check basic labs, but likely reassurance all that is needed.  Basically no concern about bruising - check CBC to be sure.  Check ferritin  Basic LE care reviewed  New Prescriptions   No medications on file   Modified Medications   No medications on file   Orders Placed This Encounter  Procedures  . CBC with Differential  . Ferritin   Follow-up: No Follow-up on file. Unless noted above, the patient is to follow-up if symptoms worsen. Red flags were reviewed with the patient.  Signed,  Elpidio GaleaSpencer T. Jina Olenick, MD, CAQ Sports Medicine   Discontinued Medications   HYDROCORTISONE-PRAMOXINE [facility](ANALPRAM-HC) 2.5-1 % RECTAL CREAM       Current Medications at Discharge:   Medication List       This list is accurate as of: 01/05/14 11:59 PM.  Always use your most recent med list.               CALCIUM + D PO  Take by mouth daily.     fish oil-omega-3 fatty acids 1000 MG capsule  Take 2 g by mouth daily.     multivitamin tablet  Take 1 tablet by mouth daily.

## 2014-01-06 LAB — CBC WITH DIFFERENTIAL/PLATELET
Basophils Absolute: 0 10*3/uL (ref 0.0–0.1)
Basophils Relative: 0.2 % (ref 0.0–3.0)
Eosinophils Absolute: 0.2 10*3/uL (ref 0.0–0.7)
Eosinophils Relative: 3.6 % (ref 0.0–5.0)
HCT: 40.8 % (ref 36.0–46.0)
Hemoglobin: 13.4 g/dL (ref 12.0–15.0)
Lymphocytes Relative: 20.3 % (ref 12.0–46.0)
Lymphs Abs: 1.3 10*3/uL (ref 0.7–4.0)
MCHC: 32.8 g/dL (ref 30.0–36.0)
MCV: 95.4 fl (ref 78.0–100.0)
Monocytes Absolute: 0.7 10*3/uL (ref 0.1–1.0)
Monocytes Relative: 10 % (ref 3.0–12.0)
Neutro Abs: 4.4 10*3/uL (ref 1.4–7.7)
Neutrophils Relative %: 65.9 % (ref 43.0–77.0)
Platelets: 257 10*3/uL (ref 150.0–400.0)
RBC: 4.28 Mil/uL (ref 3.87–5.11)
RDW: 13.8 % (ref 11.5–15.5)
WBC: 6.6 10*3/uL (ref 4.0–10.5)

## 2014-01-06 LAB — FERRITIN: Ferritin: 13.3 ng/mL (ref 10.0–291.0)

## 2014-01-25 ENCOUNTER — Other Ambulatory Visit: Payer: Self-pay | Admitting: Obstetrics and Gynecology

## 2014-01-25 DIAGNOSIS — R921 Mammographic calcification found on diagnostic imaging of breast: Secondary | ICD-10-CM

## 2014-03-03 ENCOUNTER — Ambulatory Visit
Admission: RE | Admit: 2014-03-03 | Discharge: 2014-03-03 | Disposition: A | Discharge status: Home / Self Care | Payer: BC Managed Care – PPO | Source: Ambulatory Visit | Attending: Obstetrics and Gynecology | Admitting: Obstetrics and Gynecology

## 2014-03-03 DIAGNOSIS — R921 Mammographic calcification found on diagnostic imaging of breast: Secondary | ICD-10-CM

## 2014-03-31 ENCOUNTER — Other Ambulatory Visit: Payer: Self-pay

## 2014-04-02 ENCOUNTER — Encounter (HOSPITAL_COMMUNITY): Payer: Self-pay | Admitting: Emergency Medicine

## 2014-04-02 ENCOUNTER — Emergency Department (INDEPENDENT_AMBULATORY_CARE_PROVIDER_SITE_OTHER)
Admission: EM | Admit: 2014-04-02 | Discharge: 2014-04-02 | Disposition: A | Discharge status: Home / Self Care | Payer: BC Managed Care – PPO | Source: Home / Self Care | Attending: Family Medicine | Admitting: Family Medicine

## 2014-04-02 DIAGNOSIS — N390 Urinary tract infection, site not specified: Secondary | ICD-10-CM

## 2014-04-02 LAB — POCT URINALYSIS DIP (DEVICE)
Bilirubin Urine: NEGATIVE
Glucose, UA: NEGATIVE mg/dL
Ketones, ur: NEGATIVE mg/dL
Nitrite: NEGATIVE
Protein, ur: NEGATIVE mg/dL
Specific Gravity, Urine: 1.015 (ref 1.005–1.030)
Urobilinogen, UA: 0.2 mg/dL (ref 0.0–1.0)
pH: 7.5 (ref 5.0–8.0)

## 2014-04-02 LAB — POCT PREGNANCY, URINE: Preg Test, Ur: NEGATIVE

## 2014-04-02 MED ORDER — CEPHALEXIN 500 MG PO CAPS: 500.0000 mg | ORAL_CAPSULE | Freq: Two times a day (BID) | ORAL | Status: DC

## 2014-04-02 NOTE — Discharge Instructions (Signed)
Thank you for coming in today. Take keflex twice daily for 1 week.  Come back as needed.   Urinary Tract Infection Urinary tract infections (UTIs) can develop anywhere along your urinary tract. Your urinary tract is your body's drainage system for removing wastes and extra water. Your urinary tract includes two kidneys, two ureters, a bladder, and a urethra. Your kidneys are a pair of bean-shaped organs. Each kidney is about the size of your fist. They are located below your ribs, one on each side of your spine. CAUSES Infections are caused by microbes, which are microscopic organisms, including fungi, viruses, and bacteria. These organisms are so small that they can only be seen through a microscope. Bacteria are the microbes that most commonly cause UTIs. SYMPTOMS  Symptoms of UTIs may vary by age and gender of the patient and by the location of the infection. Symptoms in young women typically include a frequent and intense urge to urinate and a painful, burning feeling in the bladder or urethra during urination. Older women and men are more likely to be tired, shaky, and weak and have muscle aches and abdominal pain. A fever may mean the infection is in your kidneys. Other symptoms of a kidney infection include pain in your back or sides below the ribs, nausea, and vomiting. DIAGNOSIS To diagnose a UTI, your caregiver will ask you about your symptoms. Your caregiver also will ask to provide a urine sample. The urine sample will be tested for bacteria and white blood cells. White blood cells are made by your body to help fight infection. TREATMENT  Typically, UTIs can be treated with medication. Because most UTIs are caused by a bacterial infection, they usually can be treated with the use of antibiotics. The choice of antibiotic and length of treatment depend on your symptoms and the type of bacteria causing your infection. HOME CARE INSTRUCTIONS  If you were prescribed antibiotics, take them  exactly as your caregiver instructs you. Finish the medication even if you feel better after you have only taken some of the medication.  Drink enough water and fluids to keep your urine clear or pale yellow.  Avoid caffeine, tea, and carbonated beverages. They tend to irritate your bladder.  Empty your bladder often. Avoid holding urine for long periods of time.  Empty your bladder before and after sexual intercourse.  After a bowel movement, women should cleanse from front to back. Use each tissue only once. SEEK MEDICAL CARE IF:   You have back pain.  You develop a fever.  Your symptoms do not begin to resolve within 3 days. SEEK IMMEDIATE MEDICAL CARE IF:   You have severe back pain or lower abdominal pain.  You develop chills.  You have nausea or vomiting.  You have continued burning or discomfort with urination. MAKE SURE YOU:   Understand these instructions.  Will watch your condition.  Will get help right away if you are not doing well or get worse. Document Released: 03/12/2005 Document Revised: 12/02/2011 Document Reviewed: 07/11/2011 [facility] Patient Information 2015 [place] [place]. This information is not intended to replace advice given to you by your health care provider. Make sure you discuss any questions you have with your health care provider.

## 2014-04-02 NOTE — ED Provider Notes (Signed)
Jo Cohen is a 43 y.o. female who presents to Urgent Care today for UTI. Patient notes a 2 day history of urinary frequency and burning. No fevers or chills or NVD. She's tried drinking a lot of water which did not help. She has not tried any medications. She feels well otherwise   Past Medical History  Diagnosis Date  . Hemorrhoids    History  Substance Use Topics  . Smoking status: Never Smoker   . Smokeless tobacco: Never Used  . Alcohol Use: Yes     Comment: 1 or 2 per month   ROS as above Medications: No current facility-administered medications for this encounter.   Current Outpatient Prescriptions  Medication Sig Dispense Refill  . Calcium Carbonate-Vitamin D (CALCIUM + D PO) Take by mouth daily.      . fish oil-omega-3 fatty acids 1000 MG capsule Take 2 g by mouth daily.      . Multiple Vitamin (MULTIVITAMIN) tablet Take 1 tablet by mouth daily.        Exam:  BP 116/80  Pulse 60  Temp(Src) 97.8 F (36.6 C) (Oral)  Resp 18  SpO2 100%  LMP 03/19/2014 Gen: Well NAD HEENT: EOMI,  MMM Lungs: Normal work of breathing. CTABL Heart: RRR no MRG Abd: NABS, Soft. Nondistended, Nontender no CV angle tenderness to percussion. Exts: Brisk capillary refill, warm and well perfused.   Results for orders placed during the hospital encounter of 04/02/14 (from the past 24 hour(s))  POCT URINALYSIS DIP (DEVICE)     Status: Abnormal   Collection Time    04/02/14  9:45 AM      Result Value Ref Range   Glucose, UA NEGATIVE  NEGATIVE mg/dL   Bilirubin Urine NEGATIVE  NEGATIVE   Ketones, ur NEGATIVE  NEGATIVE mg/dL   Specific Gravity, Urine 1.015  1.005 - 1.030   Hgb urine dipstick SMALL (*) NEGATIVE   pH 7.5  5.0 - 8.0   Protein, ur NEGATIVE  NEGATIVE mg/dL   Urobilinogen, UA 0.2  0.0 - 1.0 mg/dL   Nitrite NEGATIVE  NEGATIVE   Leukocytes, UA MODERATE (*) NEGATIVE   No results found.  Assessment and Plan: 43 y.o. female with urinary tract infection. Treatment with  Keflex.  Discussed warning signs or symptoms. Please see discharge instructions. Patient expresses understanding.     Rodolph BongEvan S Jenaye Rickert, MD 04/02/14 [id number]

## 2014-04-02 NOTE — ED Notes (Signed)
Patient c/o frequent urination and urgency to urinate x 2 days. Patient reports she also has burning after urination with discomfort. Patient denies fever or discharge. Patient is alert and in NAD.

## 2014-05-18 ENCOUNTER — Telehealth: Payer: Self-pay

## 2014-05-18 NOTE — Telephone Encounter (Signed)
Pt left v/m; pt flying on 05/21/14 and is not sure if will need med for anxiety while flying; pt said one part of flight she will be in the air 4 hours. Pt request # 3 of anti anxiety med to [place]. Pt has never taken anxiety med before. Pt last seen 01/05/14. Pt request cb.

## 2014-05-19 MED ORDER — ALPRAZOLAM 0.5 MG PO TABS: 0.5000 mg | ORAL_TABLET | Freq: Two times a day (BID) | ORAL | Status: DC | PRN

## 2014-05-19 NOTE — Telephone Encounter (Signed)
Rx called in as prescribed, left detailed voicemail letting pt know Rx sent

## 2014-05-19 NOTE — Telephone Encounter (Signed)
Will px xanax Px written for call in   Please use caution for sedation

## 2014-05-21 NOTE — Telephone Encounter (Signed)
agree

## 2014-08-01 ENCOUNTER — Other Ambulatory Visit: Payer: Self-pay | Admitting: Obstetrics and Gynecology

## 2014-08-01 DIAGNOSIS — R921 Mammographic calcification found on diagnostic imaging of breast: Secondary | ICD-10-CM

## 2014-08-25 ENCOUNTER — Ambulatory Visit
Admission: RE | Admit: 2014-08-25 | Discharge: 2014-08-25 | Disposition: A | Discharge status: Home / Self Care | Payer: BLUE CROSS/BLUE SHIELD | Source: Ambulatory Visit | Attending: Obstetrics and Gynecology | Admitting: Obstetrics and Gynecology

## 2014-08-25 DIAGNOSIS — R921 Mammographic calcification found on diagnostic imaging of breast: Secondary | ICD-10-CM

## 2014-12-29 ENCOUNTER — Encounter: Payer: Self-pay | Admitting: Internal Medicine

## 2014-12-29 ENCOUNTER — Ambulatory Visit (INDEPENDENT_AMBULATORY_CARE_PROVIDER_SITE_OTHER): Payer: BLUE CROSS/BLUE SHIELD | Admitting: Internal Medicine

## 2014-12-29 VITALS — BP 110/64 | HR 58 | Temp 97.8°F | Wt 133.0 lb

## 2014-12-29 DIAGNOSIS — R059 Cough, unspecified: Secondary | ICD-10-CM

## 2014-12-29 DIAGNOSIS — R0982 Postnasal drip: Secondary | ICD-10-CM | POA: Diagnosis not present

## 2014-12-29 DIAGNOSIS — R05 Cough: Secondary | ICD-10-CM | POA: Diagnosis not present

## 2014-12-29 MED ORDER — AMOXICILLIN 875 MG PO TABS: 875.0000 mg | ORAL_TABLET | Freq: Two times a day (BID) | ORAL | Status: DC

## 2014-12-29 NOTE — Progress Notes (Signed)
Pre visit review using our clinic review tool, if applicable. No additional management support is needed unless otherwise documented below in the visit note. 

## 2014-12-29 NOTE — Patient Instructions (Signed)
Cough, Adult  A cough is a reflex that helps clear your throat and airways. It can help heal the body or may be a reaction to an irritated airway. A cough may only last 2 or 3 weeks (acute) or may last more than 8 weeks (chronic).  CAUSES Acute cough:  Viral or bacterial infections. Chronic cough:  Infections.  Allergies.  Asthma.  Post-nasal drip.  Smoking.  Heartburn or acid reflux.  Some medicines.  Chronic lung problems (COPD).  Cancer. SYMPTOMS   Cough.  Fever.  Chest pain.  Increased breathing rate.  High-pitched whistling sound when breathing (wheezing).  Colored mucus that you cough up (sputum). TREATMENT   A bacterial cough may be treated with antibiotic medicine.  A viral cough must run its course and will not respond to antibiotics.  Your caregiver may recommend other treatments if you have a chronic cough. HOME CARE INSTRUCTIONS   Only take over-the-counter or prescription medicines for pain, discomfort, or fever as directed by your caregiver. Use cough suppressants only as directed by your caregiver.  Use a cold steam vaporizer or humidifier in your bedroom or home to help loosen secretions.  Sleep in a semi-upright position if your cough is worse at night.  Rest as needed.  Stop smoking if you smoke. SEEK IMMEDIATE MEDICAL CARE IF:   You have pus in your sputum.  Your cough starts to worsen.  You cannot control your cough with suppressants and are losing sleep.  You begin coughing up blood.  You have difficulty breathing.  You develop pain which is getting worse or is uncontrolled with medicine.  You have a fever. MAKE SURE YOU:   Understand these instructions.  Will watch your condition.  Will get help right away if you are not doing well or get worse. Document Released: 11/29/2010 Document Revised: 08/25/2011 Document Reviewed: 11/29/2010 [employer] Patient Information 2015 [employer]. This information is not intended  to replace advice given to you by your health care provider. Make sure you discuss any questions you have with your health care provider.  

## 2014-12-29 NOTE — Progress Notes (Signed)
HPI  Pt present to the clinic today with c/o ear fullness, nasal congestion, scratchy throat with some difficulty swallowing and cough. This started 3 days ago. The cough is dry and non productive. She denies fever, chills or shortness of breath. She has not tried anything OTC. She has no history of allergies or asthma. She has had sick contacts with similar symptoms. She is mainly concerned because she is about to leave for a cruise and wanted to make sure she did not need antibiotics.  Review of Systems    Past Medical History  Diagnosis Date  . Hemorrhoids     No family history on file.  History   Social History  . Marital Status: Divorced    Spouse Name: N/A  . Number of Children: N/A  . Years of Education: N/A   Occupational History  . Not on file.   Social History Main Topics  . Smoking status: Never Smoker   . Smokeless tobacco: Never Used  . Alcohol Use: Yes     Comment: 1 or 2 per month  . Drug Use: No  . Sexual Activity: Not on file   Other Topics Concern  . Not on file   Social History Narrative    No Known Allergies   Constitutional: Denies headache, fatigue, fever or abrupt weight changes.  HEENT:  Positive ear fullness, nasal congestion and sore throat. Denies eye redness, ear pain, ringing in the ears, wax buildup, runny nose or bloody nose. Respiratory: Positive cough. Denies difficulty breathing or shortness of breath.  Cardiovascular: Denies chest pain, chest tightness, palpitations or swelling in the hands or feet.   No other specific complaints in a complete review of systems (except as listed in HPI above).  Objective:  BP 110/64 mmHg  Pulse 58  Temp(Src) 97.8 F (36.6 C) (Oral)  Wt 133 lb (60.328 kg)  SpO2 98%  LMP 12/29/2014   General: Appears her stated age, well developed, well nourished in NAD. HEENT: Head: normal shape and size, no sinus tenderness noted; Eyes: sclera white, no icterus, conjunctiva pink, PERRLA and EOMs intact;  Ears: Tm's gray and intact, normal light reflex; Nose: mucosa pink and moist, septum midline; Throat/Mouth: + PND. Teeth present, mucosa pink and moist, no exudate noted, no lesions or ulcerations noted.  Neck: No adenopathy noted. Cardiovascular: Normal rate and rhythm. S1,S2 noted.  No murmur, rubs or gallops noted.  Pulmonary/Chest: Normal effort and positive vesicular breath sounds. No respiratory distress. No wheezes, rales or ronchi noted.      Assessment & Plan:   Cough secondary to postnasal drip:  Can use a Neti Pot which can be purchased from your local drug store. Flonase 2 sprays each nostril for 3 days and then as needed. Delsym for cough Will give RX for Amoxil 875 BID x 10 days to take with her in case she is worse  RTC as needed or if symptoms persist.

## 2015-03-16 ENCOUNTER — Encounter: Payer: Self-pay | Admitting: Podiatry

## 2015-03-16 ENCOUNTER — Ambulatory Visit (INDEPENDENT_AMBULATORY_CARE_PROVIDER_SITE_OTHER): Payer: BLUE CROSS/BLUE SHIELD | Admitting: Podiatry

## 2015-03-16 ENCOUNTER — Ambulatory Visit (INDEPENDENT_AMBULATORY_CARE_PROVIDER_SITE_OTHER): Payer: BLUE CROSS/BLUE SHIELD

## 2015-03-16 VITALS — BP 106/43 | HR 74 | Ht 66.5 in | Wt 135.0 lb

## 2015-03-16 DIAGNOSIS — M779 Enthesopathy, unspecified: Secondary | ICD-10-CM

## 2015-03-16 DIAGNOSIS — M79673 Pain in unspecified foot: Secondary | ICD-10-CM

## 2015-03-16 DIAGNOSIS — M205X1 Other deformities of toe(s) (acquired), right foot: Secondary | ICD-10-CM | POA: Diagnosis not present

## 2015-03-16 MED ORDER — TRIAMCINOLONE ACETONIDE 10 MG/ML IJ SUSP
10.0000 mg | Freq: Once | INTRAMUSCULAR | Status: AC
  Administered 2015-03-16: 10 mg

## 2015-03-16 NOTE — Progress Notes (Signed)
Subjective:     Patient ID: Jo Cohen, female   DOB: 1971/03/06, 44 y.o.   MRN: [id number]  HPI patient states I'm having a lot of pain in the ball of my right foot for about 6 months. I'm quite active and I worked with a Psychologist, educational and do a lot of intense activities and it hurts me in my tennis shoes and in shoes that have a heel   Review of Systems  All other systems reviewed and are negative.      Objective:   Physical Exam  Constitutional: She is oriented to person, place, and time.  Cardiovascular: Intact distal pulses.   Musculoskeletal: Normal range of motion.  Neurological: She is oriented to person, place, and time.  Skin: Skin is warm.  Nursing note and vitals reviewed.  neurovascular status intact muscle strength adequate range of motion within normal limits with patient noted to have good digital perfusion and is well oriented 3. Patient has elongated first metatarsal segment right with what appears to be a functional hallux limitus deformity and inflammation around the joint surface mostly on the lateral side. There is also slight discomfort on the plantar fascial at its insertion into the metatarsal but that appears to be secondary in the left foot shows mild deformity but not to the same degree     Assessment:     Hallux limitus deformity right of a functional nature with inflammatory changes around the first MPJ    Plan:     H&P x-rays reviewed and condition explained to patient. Today I injected the joint 3 mg Kenalog 5 mg Xylocaine and scanned for a customized Berkley type orthotic to provide for arch control and for offloading of the first MPJ right. Patient will reduce some of her activities while working out and will be seen back again when orthotics are ready or earlier if necessary

## 2015-03-16 NOTE — Progress Notes (Signed)
   Subjective:    Patient ID: Jo Cohen, female    DOB: Feb 14, 1971, 44 y.o.   MRN: [id number]  HPI Patient presents with foot pain in their right foot, ball of foot radiating up to great toe; x4-5 months   Review of Systems  All other systems reviewed and are negative.      Objective:   Physical Exam        Assessment & Plan:

## 2015-04-13 ENCOUNTER — Ambulatory Visit (INDEPENDENT_AMBULATORY_CARE_PROVIDER_SITE_OTHER): Payer: BLUE CROSS/BLUE SHIELD | Admitting: Podiatry

## 2015-04-13 VITALS — BP 106/66 | HR 72 | Resp 16

## 2015-04-13 DIAGNOSIS — M205X1 Other deformities of toe(s) (acquired), right foot: Secondary | ICD-10-CM | POA: Diagnosis not present

## 2015-04-13 NOTE — Patient Instructions (Signed)

## 2015-04-15 NOTE — Progress Notes (Signed)
Subjective:     Patient ID: Jo Cohen, female   DOB: 11/19/1970, 44 y.o.   MRN: [id number]  HPI patient states it is improved over where it was but I still have some discomfort   Review of Systems     Objective:   Physical Exam Neurovascular status intact muscle strength adequate with discomfort still noted in the right first MPJ lateral side that has improved but is still present    Assessment:     Hallux limitus deformity with capsulitis right improved but present    Plan:     Orthotics dispensed with instructions discussed shoe gear type and the fact will have to watch this on approximate twice yearly basis. And the chances for surgery are very strong for the long-term

## 2015-05-25 ENCOUNTER — Ambulatory Visit: Payer: BLUE CROSS/BLUE SHIELD | Admitting: Podiatry

## 2015-07-18 ENCOUNTER — Ambulatory Visit (INDEPENDENT_AMBULATORY_CARE_PROVIDER_SITE_OTHER): Payer: BLUE CROSS/BLUE SHIELD | Admitting: Family Medicine

## 2015-07-18 ENCOUNTER — Encounter: Payer: Self-pay | Admitting: Family Medicine

## 2015-07-18 VITALS — BP 96/64 | HR 81 | Temp 98.3°F | Ht 66.5 in | Wt 137.0 lb

## 2015-07-18 DIAGNOSIS — M2669 Other specified disorders of temporomandibular joint: Secondary | ICD-10-CM

## 2015-07-18 DIAGNOSIS — M26649 Arthritis of unspecified temporomandibular joint: Secondary | ICD-10-CM

## 2015-07-18 MED ORDER — CYCLOBENZAPRINE HCL 5 MG PO TABS: 5.0000 mg | ORAL_TABLET | Freq: Every day | ORAL | Status: DC

## 2015-07-18 MED ORDER — NORTRIPTYLINE HCL 25 MG PO CAPS: 25.0000 mg | ORAL_CAPSULE | Freq: Every day | ORAL | Status: DC

## 2015-07-18 NOTE — Progress Notes (Signed)
Dr. Karleen Hampshire T. Remmington Urieta, MD, CAQ Sports [facility] [address] [place], [address] Phone: [phone] Fax: [phone]  07/18/2015  Patient: Jo Cohen, MRN: [id number], DOB: [birth year], 45 y.o.  Primary Physician:  Hannah Beat, MD   Chief Complaint  Patient presents with  . Temporomandibular Joint Pain   Subjective:   Jo Cohen is a 45 y.o. very pleasant female patient who presents with the following:  TMJ for several years, and tremendous pain in a night guard. At least 8 years or more.  Bite guard - front tooth.  sshe is seen multiple people including her regular dentist as well as an oral Careers adviser.  The oral surgeon recently retired, and she is not sure what to do who to see, but she is in pain much of the time.  Her night guard does not seem to help at all.  She does have a tremendous amount crepitus at her TMJ joint.  TMJ expert?? Ask dentists?  Past Medical History, Surgical History, Social History, Family History, Problem List, Medications, and Allergies have been reviewed and updated if relevant.  There are no active problems to display for this patient.   Past Medical History  Diagnosis Date  . Hemorrhoids     Past Surgical History  Procedure Laterality Date  . Cesarean section  1998  . Appendectomy  2007    Social History   Social History  . Marital Status: Divorced    Spouse Name: N/A  . Number of Children: N/A  . Years of Education: N/A   Occupational History  . Not on file.   Social History Main Topics  . Smoking status: Never Smoker   . Smokeless tobacco: Never Used  . Alcohol Use: 0.0 oz/week    0 Standard drinks or equivalent per week     Comment: 1 or 2 per month  . Drug Use: No  . Sexual Activity: Not on file   Other Topics Concern  . Not on file   Social History Narrative    No family history on file.  No Known Allergies  Medication list reviewed and updated in full in [facility].   GEN: No acute illnesses, no fevers, chills. GI: No n/v/d, eating normally Pulm: No SOB Interactive and getting along well at home.  Otherwise, ROS is as per the HPI.  Objective:   BP 96/64 mmHg  Pulse 81  Temp(Src) 98.3 F (36.8 C) (Oral)  Ht 5' 6.5" (1.689 m)  Wt 137 lb (62.143 kg)  BMI 21.78 kg/m2  LMP 06/25/2015  GEN: WDWN, NAD, Non-toxic, A & O x 3 HEENT: Atraumatic, Normocephalic. Neck supple. No masses, No LAD. Crepitus with opening and closing each side of her jaw. Ears and Nose: No external deformity. CV: RRR, No M/G/R. No JVD. No thrill. No extra heart sounds. PULM: CTA B, no wheezes, crackles, rhonchi. No retractions. No resp. distress. No accessory muscle use. EXTR: No c/c/e NEURO Normal gait.  PSYCH: Normally interactive. Conversant. Not depressed or anxious appearing.  Calm demeanor.   Laboratory and Imaging Data:  Assessment and Plan:   TMJ arthritis  Very challenging.  I looked up a review article on up-to-date, and added a couple of medications that may help.  Thing that she would be well served by seeing someone who is an expert in TMJ.  I did my best trying to find someone in our area who has an expertise here, and I recommended  to her that she should see Dr. Doylene Canning in [place], who is a fellow of the American Academy of craniofacial pain.  Follow-up: No Follow-up on file.  New Prescriptions   CYCLOBENZAPRINE (FLEXERIL) 5 MG TABLET    Take 1 tablet (5 mg total) by mouth at bedtime.   NORTRIPTYLINE (PAMELOR) 25 MG CAPSULE    Take 1 capsule (25 mg total) by mouth at bedtime.   Modified Medications   No medications on file   No orders of the defined types were placed in this encounter.    Signed,  Elpidio Galea. Julyana Woolverton, MD   Patient's Medications  New Prescriptions   CYCLOBENZAPRINE (FLEXERIL) 5 MG TABLET    Take 1 tablet (5 mg total) by mouth at bedtime.   NORTRIPTYLINE (PAMELOR) 25 MG CAPSULE    Take 1 capsule (25 mg total) by  mouth at bedtime.  Previous Medications   CALCIUM CARBONATE-VITAMIN D (CALCIUM + D PO)    Take by mouth daily.   FERROUS SULFATE 325 (65 FE) MG TABLET    Take 325 mg by mouth daily with breakfast.   MULTIPLE VITAMIN (MULTIVITAMIN) TABLET    Take 1 tablet by mouth daily.  Modified Medications   No medications on file  Discontinued Medications   No medications on file

## 2015-07-18 NOTE — Progress Notes (Signed)
Pre visit review using our clinic review tool, if applicable. No additional management support is needed unless otherwise documented below in the visit note. 

## 2015-07-22 ENCOUNTER — Encounter: Payer: Self-pay | Admitting: Family Medicine

## 2015-08-14 ENCOUNTER — Other Ambulatory Visit: Payer: Self-pay | Admitting: Obstetrics and Gynecology

## 2015-08-14 DIAGNOSIS — R921 Mammographic calcification found on diagnostic imaging of breast: Secondary | ICD-10-CM

## 2015-08-27 ENCOUNTER — Ambulatory Visit
Admission: RE | Admit: 2015-08-27 | Discharge: 2015-08-27 | Disposition: A | Discharge status: Home / Self Care | Payer: BLUE CROSS/BLUE SHIELD | Source: Ambulatory Visit | Attending: Obstetrics and Gynecology | Admitting: Obstetrics and Gynecology

## 2015-08-27 DIAGNOSIS — R921 Mammographic calcification found on diagnostic imaging of breast: Secondary | ICD-10-CM

## 2016-01-30 ENCOUNTER — Telehealth: Payer: Self-pay | Admitting: Family Medicine

## 2016-01-30 NOTE — Telephone Encounter (Signed)
[facility] Client TELEPHONE ADVICE RECORD [facility] Patient Name: Jo Cohen DOB: 09/21/1970 Initial Comment [name] Nurse Assessment Nurse: Debera Latalston, RN, Tinnie GensJeffrey Date/Time [name](Eastern Time): 01/30/2016 10:12:24 AM Confirm and document reason for call. If symptomatic, describe symptoms. You must click the next button to save text entered. ---Patient hurt her back at the gym. Hurt back lifting at gym last night. Has the patient traveled out of the country within the last 30 days? ---No Does the patient have any new or worsening symptoms? ---Yes Will a triage be completed? ---Yes Related visit to physician within the last 2 weeks? ---No Does the PT have any chronic conditions? (i.e. diabetes, asthma, etc.) ---No Is the patient pregnant or possibly pregnant? (Ask all females between the ages of 612-55) ---No Is this a behavioral health or substance abuse call? ---No Guidelines Guideline Title Affirmed Question Affirmed Notes Back Injury Back pain or stiffness from bending or twisting injury (all triage questions negative) Final Disposition User Home Care Debera Latalston, RN, Tinnie GensJeffrey Disagree/Comply: Comply

## 2016-01-30 NOTE — Telephone Encounter (Signed)
PLEASE NOTE: All timestamps contained within this report are represented as [place] Standard Time. CONFIDENTIALTY NOTICE: This fax transmission is intended only for the addressee. It contains information that is legally privileged, confidential or otherwise protected from use or disclosure. If you are not the intended recipient, you are strictly prohibited from reviewing, disclosing, copying using or disseminating any of this information or taking any action in reliance on or regarding this information. If you have received this fax in error, please notify us immediately by telephone so that we can arrange for its return to us. Phone: [phone], Toll-Free: [phone], Fax: [phone] Page: 1 of 2 Call Id: [id number] [facility] Client TELEPHONE ADVICE RECORD [facility] Patient Name: Jo EveSUSAN Scheller Gender: Female DOB: 04/12/1971 Age: [age] Y 6 M 11 D Return Phone Number: [phone] (Primary) Address: City/State/Zip: [place] Client [facility] Physician Copland, Karleen HampshireSpencer - MD Contact Type Call Who Is Calling Patient / Member / Family / Caregiver Call Type Triage / Clinical Relationship To Patient Self Return Phone Number [phone] (Primary) Chief Complaint Back Injury Reason for Call Symptomatic / Request for Health Information Initial Comment [name] Appointment Disposition EMR Appointment Not Necessary Info pasted into Epic Yes PreDisposition Did not know what to do Translation No Nurse Assessment Nurse: Debera Latalston, RN, Tinnie GensJeffrey Date/Time [name](Eastern Time): 01/30/2016 10:12:24 AM Confirm and document reason for call. If symptomatic, describe symptoms. You must click the next button to save text entered. ---Patient hurt her back at the gym. Hurt back lifting at gym last night. Has the patient traveled out of the country within the last  30 days? ---No Does the patient have any new or worsening symptoms? ---Yes Will a triage be completed? ---Yes Related visit to physician within the last 2 weeks? ---No Does the PT have any chronic conditions? (i.e. diabetes, asthma, etc.) ---No Is the patient pregnant or possibly pregnant? (Ask all females between the ages of 7312-55) ---No Is this a behavioral health or substance abuse call? ---No Guidelines Guideline Title Affirmed Question Affirmed Notes Nurse Date/Time (Eastern Time) Back Injury Back pain or stiffness from bending or twisting injury (all triage questions negative) Debera Latalston, RN, Tinnie GensJeffrey 01/30/2016 10:13:34 AM Disp. Time [name](Eastern Time) Disposition Final User 01/30/2016 10:17:15 AM Home Care Yes Debera Latalston, RN, Tinnie GensJeffrey PLEASE NOTE: All timestamps contained within this report are represented as [place] Standard Time. CONFIDENTIALTY NOTICE: This fax transmission is intended only for the addressee. It contains information that is legally privileged, confidential or otherwise protected from use or disclosure. If you are not the intended recipient, you are strictly prohibited from reviewing, disclosing, copying using or disseminating any of this information or taking any action in reliance on or regarding this information. If you have received this fax in error, please notify us immediately by telephone so that we can arrange for its return to us. Phone: [phone], Toll-Free: [phone], Fax: [phone] Page: 2 of 2 Call Id: [id number] Caller Understands: Yes Disagree/Comply: Comply Care Advice Given Per Guideline CARE ADVICE given per Back Injury (Adult) guideline. * You become worse * Pain or swelling lasts over 7 days * Pain not improving after 3 days * Swelling or bruise becomes over 4 inches (10 cm; over size of palm). * Severe pain persists over 2 hours after pain medicine and ice CALL BACK IF: REST VS. MOVEMENT: Movement of the back is generally more  healing in  the long term than rest. Continue ordinary activities as much as your pain permits. Rest should only be used for the first couple days after an injury. HOME CARE: You should be able to treat this at home. REASSURANCE - BENDING OR TWISTING INJURY (STRAIN, SPRAIN): * This sounds like a minor strain or sprain of the back. Strain and sprain are the medical terms used to describe over-stretching of the muscles and ligaments of the back. A twisting or bending injury can cause back strain and sprain. The main symptom is pain that gets worse with movement. * It also sounds like you can take care of this yourself at home. * Here is some care advice that may help. LOCAL COLD: * Apply a cold pack or an ice bag (wrapped in a moist towel) to the area for 20 minutes. Repeat in 1 hour, then every 4 hours while awake. * Continue this for the first 48 hours after an injury (Reason: to reduce the swelling and pain). LOCAL HEAT: * Beginning 48 hours after an injury, apply a warm washcloth or heating pad for 10 minutes three times a day. * This will help increase circulation and improve healing. PAIN MEDICINES: * For pain relief, take acetaminophen, ibuprofen, or naproxen. * Use the lowest amount that makes your pain feel better. ACETAMINOPHEN (E.G., TYLENOL): * Take 650 mg (two 325 mg pills) by mouth every 4-6 hours as needed. Each Regular Strength Tylenol pill has 325 mg of acetaminophen. The most you should take each day is 3,250 mg (10 Regular Strength pills a day). * Another choice is to take 1,000 mg (two 500 mg pills) every 8 hours as needed. Each Extra Strength Tylenol pill has 500 mg of acetaminophen. The most you should take each day is 3,000 mg (6 Extra Strength pills a day). IBUPROFEN (E.G., MOTRIN, ADVIL): * Take 400 mg (two 200 mg pills) by mouth every 6 hours as needed. * Another choice is to take 600 mg (three 200 mg pills) by mouth every 8 hours as needed. * The most you should take each day is 1,200 mg  (six 200 mg pills a day), unless your doctor has told you to take more. CAUTION - NSAIDS (E.G., IBUPROFEN, NAPROXEN): * Do not take nonsteroidal anti-inflammatory drugs (NSAIDs) if you have stomach problems, kidney disease, heart failure, or other contraindications to using this type of medication. * Do not take NSAID medications for over 7 days without consulting your PCP. * Do not take NSAID medications if you are pregnant. * You may take this medicine with or without food. Taking it with food or milk may lessen the chance the drug will upset your stomach. * GASTROINTESTINAL RISK: There is an increased risk of stomach ulcers, GI bleeding, perforation. * CARDIOVASCULAR RISK: There may be an increased risk of heart attack and stroke. AVOID: Avoid heavy lifting and any sports activities for the first week after an injury.

## 2016-01-30 NOTE — Telephone Encounter (Signed)
Reasonable poc 

## 2016-03-12 ENCOUNTER — Other Ambulatory Visit (HOSPITAL_COMMUNITY): Payer: Self-pay | Admitting: Obstetrics and Gynecology

## 2016-03-12 DIAGNOSIS — R102 Pelvic and perineal pain: Secondary | ICD-10-CM

## 2016-03-18 ENCOUNTER — Ambulatory Visit (HOSPITAL_COMMUNITY)
Admission: RE | Admit: 2016-03-18 | Discharge: 2016-03-18 | Disposition: A | Discharge status: Home / Self Care | Payer: BLUE CROSS/BLUE SHIELD | Source: Ambulatory Visit | Attending: Obstetrics and Gynecology | Admitting: Obstetrics and Gynecology

## 2016-03-18 DIAGNOSIS — N920 Excessive and frequent menstruation with regular cycle: Secondary | ICD-10-CM | POA: Diagnosis not present

## 2016-03-18 DIAGNOSIS — R102 Pelvic and perineal pain: Secondary | ICD-10-CM | POA: Diagnosis not present

## 2016-05-06 DIAGNOSIS — J3489 Other specified disorders of nose and nasal sinuses: Secondary | ICD-10-CM | POA: Insufficient documentation

## 2016-05-22 ENCOUNTER — Ambulatory Visit (INDEPENDENT_AMBULATORY_CARE_PROVIDER_SITE_OTHER): Payer: BLUE CROSS/BLUE SHIELD

## 2016-05-22 ENCOUNTER — Ambulatory Visit (INDEPENDENT_AMBULATORY_CARE_PROVIDER_SITE_OTHER): Payer: BLUE CROSS/BLUE SHIELD | Admitting: Podiatry

## 2016-05-22 ENCOUNTER — Encounter: Payer: Self-pay | Admitting: Podiatry

## 2016-05-22 DIAGNOSIS — M7751 Other enthesopathy of right foot: Secondary | ICD-10-CM | POA: Diagnosis not present

## 2016-05-22 DIAGNOSIS — M778 Other enthesopathies, not elsewhere classified: Secondary | ICD-10-CM

## 2016-05-22 DIAGNOSIS — M205X1 Other deformities of toe(s) (acquired), right foot: Secondary | ICD-10-CM

## 2016-05-22 DIAGNOSIS — M779 Enthesopathy, unspecified: Principal | ICD-10-CM

## 2016-05-22 MED ORDER — TRIAMCINOLONE ACETONIDE 10 MG/ML IJ SUSP
10.0000 mg | Freq: Once | INTRAMUSCULAR | Status: AC
Start: 2016-05-22 — End: 2016-05-22
  Administered 2016-05-22: 10 mg

## 2016-05-25 NOTE — Progress Notes (Signed)
Subjective:     Patient ID: Jo Cohen, female   DOB: 03/10/1971, 45 y.o.   MRN: [id number]  HPI patient presents stating her right big toe started to hurt about a month ago but was good for over a year   Review of Systems     Objective:   Physical Exam Neurovascular status intact muscle strength adequate with patient's right big toe joint being sore at the first MPJ right with fluid buildup noted and mild range of motion loss    Assessment:     Hallux limitus with inflammatory capsulitis first MPJ right    Plan:     H&P x-rays reviewed and today injected the right first MPJ 3 mg Kenalog 5 mg Xylocaine and advised on continued monitoring and the possibility at one point in the future this may would require surgical intervention  X-ray report indicates there is mild spurring but no indications of stress fracture arthritis

## 2016-07-16 DIAGNOSIS — H2513 Age-related nuclear cataract, bilateral: Secondary | ICD-10-CM | POA: Diagnosis not present

## 2016-07-16 DIAGNOSIS — H5203 Hypermetropia, bilateral: Secondary | ICD-10-CM | POA: Diagnosis not present

## 2016-07-16 DIAGNOSIS — H40033 Anatomical narrow angle, bilateral: Secondary | ICD-10-CM | POA: Diagnosis not present

## 2016-07-16 DIAGNOSIS — H11153 Pinguecula, bilateral: Secondary | ICD-10-CM | POA: Diagnosis not present

## 2016-07-16 DIAGNOSIS — H524 Presbyopia: Secondary | ICD-10-CM | POA: Diagnosis not present

## 2016-07-30 ENCOUNTER — Other Ambulatory Visit: Payer: Self-pay | Admitting: Obstetrics and Gynecology

## 2016-07-30 DIAGNOSIS — Z1231 Encounter for screening mammogram for malignant neoplasm of breast: Secondary | ICD-10-CM

## 2016-08-11 DIAGNOSIS — G8929 Other chronic pain: Secondary | ICD-10-CM | POA: Insufficient documentation

## 2016-08-11 DIAGNOSIS — R51 Headache: Secondary | ICD-10-CM | POA: Diagnosis not present

## 2016-08-11 DIAGNOSIS — J3489 Other specified disorders of nose and nasal sinuses: Secondary | ICD-10-CM | POA: Diagnosis not present

## 2016-08-11 DIAGNOSIS — M2669 Other specified disorders of temporomandibular joint: Secondary | ICD-10-CM | POA: Diagnosis not present

## 2016-08-11 DIAGNOSIS — J342 Deviated nasal septum: Secondary | ICD-10-CM | POA: Diagnosis not present

## 2016-08-29 ENCOUNTER — Ambulatory Visit
Admission: RE | Admit: 2016-08-29 | Discharge: 2016-08-29 | Disposition: A | Discharge status: Home / Self Care | Payer: BLUE CROSS/BLUE SHIELD | Source: Ambulatory Visit | Attending: Obstetrics and Gynecology | Admitting: Obstetrics and Gynecology

## 2016-08-29 DIAGNOSIS — Z1231 Encounter for screening mammogram for malignant neoplasm of breast: Secondary | ICD-10-CM

## 2016-09-01 ENCOUNTER — Other Ambulatory Visit: Payer: Self-pay | Admitting: Obstetrics and Gynecology

## 2016-09-01 DIAGNOSIS — R928 Other abnormal and inconclusive findings on diagnostic imaging of breast: Secondary | ICD-10-CM

## 2016-09-03 ENCOUNTER — Other Ambulatory Visit: Payer: BLUE CROSS/BLUE SHIELD

## 2016-09-03 ENCOUNTER — Ambulatory Visit
Admission: RE | Admit: 2016-09-03 | Discharge: 2016-09-03 | Disposition: A | Discharge status: Home / Self Care | Payer: BLUE CROSS/BLUE SHIELD | Source: Ambulatory Visit | Attending: Obstetrics and Gynecology | Admitting: Obstetrics and Gynecology

## 2016-09-03 DIAGNOSIS — N6489 Other specified disorders of breast: Secondary | ICD-10-CM | POA: Diagnosis not present

## 2016-09-03 DIAGNOSIS — R928 Other abnormal and inconclusive findings on diagnostic imaging of breast: Secondary | ICD-10-CM

## 2016-09-03 DIAGNOSIS — R922 Inconclusive mammogram: Secondary | ICD-10-CM | POA: Diagnosis not present

## 2016-09-12 DIAGNOSIS — H66003 Acute suppurative otitis media without spontaneous rupture of ear drum, bilateral: Secondary | ICD-10-CM | POA: Diagnosis not present

## 2016-12-25 DIAGNOSIS — L814 Other melanin hyperpigmentation: Secondary | ICD-10-CM | POA: Diagnosis not present

## 2016-12-25 DIAGNOSIS — L821 Other seborrheic keratosis: Secondary | ICD-10-CM | POA: Diagnosis not present

## 2016-12-25 DIAGNOSIS — C44519 Basal cell carcinoma of skin of other part of trunk: Secondary | ICD-10-CM | POA: Diagnosis not present

## 2017-03-02 DIAGNOSIS — Z01419 Encounter for gynecological examination (general) (routine) without abnormal findings: Secondary | ICD-10-CM | POA: Diagnosis not present

## 2017-03-02 DIAGNOSIS — Z130 Encounter for screening for diseases of the blood and blood-forming organs and certain disorders involving the immune mechanism: Secondary | ICD-10-CM | POA: Diagnosis not present

## 2017-03-02 DIAGNOSIS — Z6822 Body mass index (BMI) 22.0-22.9, adult: Secondary | ICD-10-CM | POA: Diagnosis not present

## 2017-03-02 DIAGNOSIS — Z124 Encounter for screening for malignant neoplasm of cervix: Secondary | ICD-10-CM | POA: Diagnosis not present

## 2017-03-02 DIAGNOSIS — Z1389 Encounter for screening for other disorder: Secondary | ICD-10-CM | POA: Diagnosis not present

## 2017-03-02 DIAGNOSIS — N920 Excessive and frequent menstruation with regular cycle: Secondary | ICD-10-CM | POA: Diagnosis not present

## 2017-03-09 DIAGNOSIS — Z124 Encounter for screening for malignant neoplasm of cervix: Secondary | ICD-10-CM | POA: Diagnosis not present

## 2017-03-09 DIAGNOSIS — Z01419 Encounter for gynecological examination (general) (routine) without abnormal findings: Secondary | ICD-10-CM | POA: Diagnosis not present

## 2017-03-25 ENCOUNTER — Ambulatory Visit: Payer: BLUE CROSS/BLUE SHIELD | Admitting: Podiatry

## 2017-04-01 DIAGNOSIS — M26633 Articular disc disorder of bilateral temporomandibular joint: Secondary | ICD-10-CM | POA: Diagnosis not present

## 2017-04-01 DIAGNOSIS — M2669 Other specified disorders of temporomandibular joint: Secondary | ICD-10-CM | POA: Diagnosis not present

## 2017-04-01 DIAGNOSIS — M7911 Myalgia of mastication muscle: Secondary | ICD-10-CM | POA: Diagnosis not present

## 2017-04-06 DIAGNOSIS — M2669 Other specified disorders of temporomandibular joint: Secondary | ICD-10-CM | POA: Diagnosis not present

## 2017-04-06 DIAGNOSIS — M26633 Articular disc disorder of bilateral temporomandibular joint: Secondary | ICD-10-CM | POA: Diagnosis not present

## 2017-04-06 DIAGNOSIS — M7911 Myalgia of mastication muscle: Secondary | ICD-10-CM | POA: Diagnosis not present

## 2017-04-14 ENCOUNTER — Other Ambulatory Visit: Payer: Self-pay | Admitting: Dentistry

## 2017-04-14 DIAGNOSIS — M26609 Unspecified temporomandibular joint disorder, unspecified side: Secondary | ICD-10-CM

## 2017-04-22 ENCOUNTER — Other Ambulatory Visit: Payer: Self-pay | Admitting: Podiatry

## 2017-04-22 ENCOUNTER — Encounter: Payer: Self-pay | Admitting: Podiatry

## 2017-04-22 ENCOUNTER — Ambulatory Visit (INDEPENDENT_AMBULATORY_CARE_PROVIDER_SITE_OTHER): Payer: BLUE CROSS/BLUE SHIELD

## 2017-04-22 ENCOUNTER — Ambulatory Visit (INDEPENDENT_AMBULATORY_CARE_PROVIDER_SITE_OTHER): Payer: BLUE CROSS/BLUE SHIELD | Admitting: Podiatry

## 2017-04-22 DIAGNOSIS — M79671 Pain in right foot: Secondary | ICD-10-CM

## 2017-04-22 DIAGNOSIS — M2011 Hallux valgus (acquired), right foot: Secondary | ICD-10-CM

## 2017-04-22 NOTE — Progress Notes (Signed)
Subjective:    Patient ID: Jo Cohen, female   DOB: 46 y.o.   MRN: [id number]   HPI patient states she still having a lot of pain in her right foot and it does not seem to responded to the previous injection and it's becoming more more problem over these last several years. Patient states she's having trouble bearing weight down on the foot    ROS      Objective:  Physical Exam neurovascular status is intact with a marked increase in intensification on the plantar aspect of the right first metatarsal which is located almost exclusively around the fibular sesamoid. The joint itself at this time does not appear to be sore and the range of motion is good     Assessment:   Strong probability for damaged fibular sesamoid which is creating the pain this patient is experiencing      Plan:    Reviewed at great length the x-ray and the damage to the fibular sesamoid which now correlates to significant plantar pain. She admits that the pain has been in this area  Last several years that does not remember specific injury. At this time I did go ahead and I discussed removal of the fibular sesamoid and discussed this versus a deep type orthotic and she states she is tired of the pain wants to have the procedure. I allowed her to read consent form and reviewed at great length all complications associated with this including scarring and everything else and alternative treatments we could consider. She understands is no guarantee this will solve her problem but there is what appears to be damaged fibular sesamoid. I also offered her MRI and she denies wanting this at the time wants surgery signed consent form and is given all preoperative instructions understanding total recovery can take 6 months to one year. I did dispense air fracture walker for the postoperative period

## 2017-04-22 NOTE — Patient Instructions (Signed)
Pre-Operative Instructions  Congratulations, you have decided to take an important step towards improving your quality of life.  You can be assured that the doctors and staff at [facility] will be with you every step of the way.  Here are some important things you should know:  1. Plan to be at the surgery center/hospital at least 1 (one) hour prior to your scheduled time, unless otherwise directed by the surgical center/hospital staff.  You must have a responsible adult accompany you, remain during the surgery and drive you home.  Make sure you have directions to the surgical center/hospital to ensure you arrive on time. 2. If you are having surgery at [facility] or [facility], you will need a copy of your medical history and physical form from your family physician within one month prior to the date of surgery. We will give you a form for your primary physician to complete.  3. We make every effort to accommodate the date you request for surgery.  However, there are times where surgery dates or times have to be moved.  We will contact you as soon as possible if a change in schedule is required.   4. No aspirin/ibuprofen for one week before surgery.  If you are on aspirin, any non-steroidal anti-inflammatory medications (Mobic, Aleve, Ibuprofen) should not be taken seven (7) days prior to your surgery.  You make take Tylenol for pain prior to surgery.  5. Medications - If you are taking daily heart and blood pressure medications, seizure, reflux, allergy, asthma, anxiety, pain or diabetes medications, make sure you notify the surgery center/hospital before the day of surgery so they can tell you which medications you should take or avoid the day of surgery. 6. No food or drink after midnight the night before surgery unless directed otherwise by surgical center/hospital staff. 7. No alcoholic beverages 24-hours prior to surgery.  No smoking 24-hours prior or 24-hours after  surgery. 8. Wear loose pants or shorts. They should be loose enough to fit over bandages, boots, and casts. 9. Don't wear slip-on shoes. Sneakers are preferred. 10. Bring your boot with you to the surgery center/hospital.  Also bring crutches or a walker if your physician has prescribed it for you.  If you do not have this equipment, it will be provided for you after surgery. 11. If you have not been contacted by the surgery center/hospital by the day before your surgery, call to confirm the date and time of your surgery. 12. Leave-time from work may vary depending on the type of surgery you have.  Appropriate arrangements should be made prior to surgery with your employer. 13. Prescriptions will be provided immediately following surgery by your doctor.  Fill these as soon as possible after surgery and take the medication as directed. Pain medications will not be refilled on weekends and must be approved by the doctor. 14. Remove nail polish on the operative foot and avoid getting pedicures prior to surgery. 15. Wash the night before surgery.  The night before surgery wash the foot and leg well with water and the antibacterial soap provided. Be sure to pay special attention to beneath the toenails and in between the toes.  Wash for at least three (3) minutes. Rinse thoroughly with water and dry well with a towel.  Perform this wash unless told not to do so by your physician.  Enclosed: 1 Ice pack (please put in freezer the night before surgery)   1 Hibiclens skin cleaner     Pre-op instructions  If you have any questions regarding the instructions, please do not hesitate to call our office.  [place]: [address], [place], [place] [address] -- [phone]  [place]: [address] [place], [place] [address] -- [phone]  [place]: [address]  [place], [place] [address] -- [phone]  [place]: [address], [address], [place], [place] [address] -- [phone]  Website:  [email or web address] 

## 2017-04-24 ENCOUNTER — Telehealth: Payer: Self-pay | Admitting: *Deleted

## 2017-04-24 NOTE — Telephone Encounter (Signed)
"  I was in there yesterday afternoon.  His nurse and I scheduled surgery for the 27th.  I came back into work and realized I have a conflict.  I need to reschedule that if possible."

## 2017-04-28 ENCOUNTER — Ambulatory Visit
Admission: RE | Admit: 2017-04-28 | Discharge: 2017-04-28 | Disposition: A | Discharge status: Home / Self Care | Payer: BLUE CROSS/BLUE SHIELD | Source: Ambulatory Visit | Attending: Dentistry | Admitting: Dentistry

## 2017-04-28 DIAGNOSIS — M26609 Unspecified temporomandibular joint disorder, unspecified side: Secondary | ICD-10-CM

## 2017-04-29 ENCOUNTER — Other Ambulatory Visit: Payer: Self-pay | Admitting: Dentistry

## 2017-04-29 DIAGNOSIS — R6884 Jaw pain: Secondary | ICD-10-CM

## 2017-05-01 NOTE — Telephone Encounter (Signed)
I am returning your call. You wanted to reschedule your surgery.  "No, I have already spoken to someone the first of the week.  I decided to just leave it where it is."  Okay, thank you.

## 2017-05-06 ENCOUNTER — Ambulatory Visit
Admission: RE | Admit: 2017-05-06 | Discharge: 2017-05-06 | Disposition: A | Discharge status: Home / Self Care | Payer: BLUE CROSS/BLUE SHIELD | Source: Ambulatory Visit | Attending: Dentistry | Admitting: Dentistry

## 2017-05-06 DIAGNOSIS — R51 Headache: Secondary | ICD-10-CM | POA: Diagnosis not present

## 2017-05-06 DIAGNOSIS — R6884 Jaw pain: Secondary | ICD-10-CM

## 2017-05-12 ENCOUNTER — Telehealth: Payer: Self-pay | Admitting: *Deleted

## 2017-05-12 ENCOUNTER — Encounter: Payer: Self-pay | Admitting: Podiatry

## 2017-05-12 DIAGNOSIS — Y999 Unspecified external cause status: Secondary | ICD-10-CM | POA: Diagnosis not present

## 2017-05-12 DIAGNOSIS — Y929 Unspecified place or not applicable: Secondary | ICD-10-CM | POA: Diagnosis not present

## 2017-05-12 DIAGNOSIS — R011 Cardiac murmur, unspecified: Secondary | ICD-10-CM | POA: Diagnosis not present

## 2017-05-12 DIAGNOSIS — M25871 Other specified joint disorders, right ankle and foot: Secondary | ICD-10-CM | POA: Diagnosis not present

## 2017-05-12 DIAGNOSIS — S92811A Other fracture of right foot, initial encounter for closed fracture: Secondary | ICD-10-CM | POA: Diagnosis not present

## 2017-05-12 DIAGNOSIS — Y939 Activity, unspecified: Secondary | ICD-10-CM | POA: Diagnosis not present

## 2017-05-12 DIAGNOSIS — M84871 Other disorders of continuity of bone, right ankle and foot: Secondary | ICD-10-CM | POA: Diagnosis not present

## 2017-05-12 DIAGNOSIS — X58XXXA Exposure to other specified factors, initial encounter: Secondary | ICD-10-CM | POA: Diagnosis not present

## 2017-05-12 NOTE — Telephone Encounter (Signed)
[facility] states they are unable to read pt's Vicodin rx clearly looks like 6 4 to 6 hours. I called and the pharmacy staff said they had figured it was "q 4 to 6 hours".

## 2017-05-20 ENCOUNTER — Ambulatory Visit (INDEPENDENT_AMBULATORY_CARE_PROVIDER_SITE_OTHER): Payer: BLUE CROSS/BLUE SHIELD

## 2017-05-20 ENCOUNTER — Ambulatory Visit (INDEPENDENT_AMBULATORY_CARE_PROVIDER_SITE_OTHER): Payer: BLUE CROSS/BLUE SHIELD | Admitting: Podiatry

## 2017-05-20 ENCOUNTER — Encounter: Payer: Self-pay | Admitting: Podiatry

## 2017-05-20 ENCOUNTER — Other Ambulatory Visit: Payer: Self-pay | Admitting: Podiatry

## 2017-05-20 VITALS — BP 103/68 | HR 79 | Temp 97.9°F | Resp 16

## 2017-05-20 DIAGNOSIS — M2011 Hallux valgus (acquired), right foot: Secondary | ICD-10-CM | POA: Diagnosis not present

## 2017-05-20 DIAGNOSIS — M79671 Pain in right foot: Secondary | ICD-10-CM

## 2017-05-20 NOTE — Progress Notes (Signed)
Subjective:   Patient ID: Jo Cohen, female   DOB: 46 y.o.   MRN: [id number]   HPI Patient presents stating I am doing well with minimal discomfort and swelling   ROS      Objective:  Physical Exam  Neurovascular status intact negative Homans sign noted with patient found to have well-healing surgical site right plantar fibular sesamoid with wound edges well coapted proximal bruising but localized with no erythema or edema within the arch.     Assessment:  Doing well post fibular sesamoidectomy right     Plan:  H&P x-rays reviewed and today I went ahead and reapplied sterile dressing instructed continued elevation compression and immobilization and reappoint 2 weeks for suture removal or earlier if needed.  X-rays indicate satisfactory resection fibular sesamoid right

## 2017-05-20 NOTE — Addendum Note (Signed)
Addended by: Lenn SinkEGAL, Stephanine Reas S on: 05/20/2017 04:06 PM   Modules accepted: Level of Service

## 2017-06-03 ENCOUNTER — Ambulatory Visit (INDEPENDENT_AMBULATORY_CARE_PROVIDER_SITE_OTHER): Payer: BLUE CROSS/BLUE SHIELD

## 2017-06-03 ENCOUNTER — Ambulatory Visit (INDEPENDENT_AMBULATORY_CARE_PROVIDER_SITE_OTHER): Payer: BLUE CROSS/BLUE SHIELD | Admitting: Podiatry

## 2017-06-03 ENCOUNTER — Encounter: Payer: Self-pay | Admitting: Podiatry

## 2017-06-03 DIAGNOSIS — M205X1 Other deformities of toe(s) (acquired), right foot: Secondary | ICD-10-CM

## 2017-06-03 NOTE — Progress Notes (Signed)
Subjective:   Patient ID: Jo Cohen, female   DOB: 46 y.o.   MRN: [id number]   HPI Patient states she is doing well with a little bit of numbness in her big toe but overall she does not have the pain she had prior to surgery   ROS      Objective:  Physical Exam  Neurovascular status intact with patient's right plantar foot doing well with wound edges well coapted good strength of the extensor and flexor hallucis longus and good range of motion noted     Assessment:  Patient is doing well post fibular sesamoidectomy right with swelling of a mild nature and mild nerve irritation which appears to be short-term     Plan:  H&P x-ray reviewed and discussed range of motion exercises and strengthening for the flexor extensor tendon.  Patient will gradually return to soft shoe head compression stocking dispensed and was given instructions for gradual increase in activity levels  X-rays indicate satisfactory resection of the fibular sesamoid right with good positional alignment

## 2017-07-15 ENCOUNTER — Ambulatory Visit (INDEPENDENT_AMBULATORY_CARE_PROVIDER_SITE_OTHER): Payer: BLUE CROSS/BLUE SHIELD | Admitting: Podiatry

## 2017-07-15 ENCOUNTER — Encounter: Payer: Self-pay | Admitting: Podiatry

## 2017-07-15 ENCOUNTER — Ambulatory Visit (INDEPENDENT_AMBULATORY_CARE_PROVIDER_SITE_OTHER): Payer: BLUE CROSS/BLUE SHIELD

## 2017-07-15 DIAGNOSIS — M205X1 Other deformities of toe(s) (acquired), right foot: Secondary | ICD-10-CM

## 2017-07-15 NOTE — Progress Notes (Signed)
Subjective:   Patient ID: Jo AlcideSusan S Gardiner, female   DOB: 47 y.o.   MRN: [id number]   HPI Patient states the right foot still has some swelling and numbness but overall it seems to be doing well   ROS      Objective:  Physical Exam  Neurovascular status intact negative Homans sign noted with well-healed surgical sites of the fibular sesamoidal area right with good plantar and dorsiflexion of the hallux with good strength of the extensor flexor tendon     Assessment:  Patient is healing normally with swelling still notable mild degree which is normal for this.  Postop with this procedure     Plan:  Reviewed x-rays and allow patient to gradually return to normal shoe gear.  Patient will be seen back on an as-needed basis sign encounter  X-ray indicates that the fibular sesamoid is been satisfactory removed with no indications of other pathology

## 2017-07-17 NOTE — Progress Notes (Signed)
DOS 05/12/17 Removal of fibular sesamoid RT foot.

## 2017-08-07 ENCOUNTER — Other Ambulatory Visit: Payer: Self-pay | Admitting: Obstetrics and Gynecology

## 2017-08-07 DIAGNOSIS — Z139 Encounter for screening, unspecified: Secondary | ICD-10-CM

## 2017-08-25 ENCOUNTER — Telehealth: Payer: Self-pay | Admitting: Family Medicine

## 2017-08-25 MED ORDER — ALPRAZOLAM 0.5 MG PO TABS: 0.5000 mg | ORAL_TABLET | Freq: Every day | ORAL | 0 refills | Status: DC

## 2017-08-25 NOTE — Telephone Encounter (Signed)
I think that is fine. I am going to give her generic xanax that she has used before with flying.   Xanax 0.5 mg, 1 po prior to flying, #2, 0 ref  It looks like she is overdue by a few years for a physical - she should f/u some time this year.

## 2017-08-25 NOTE — Telephone Encounter (Signed)
Darl PikesSusan notified as instructed by telephone.  [place] [facility] [place].

## 2017-08-25 NOTE — Telephone Encounter (Signed)
Pt requesting medication to calm her nerves. Pt states she is going to be flying and wants to know if she can have the medication to help her through the flight.

## 2017-08-25 NOTE — Telephone Encounter (Signed)
Copied from CRM [id number]. Topic: Quick Communication - See Telephone Encounter >> Aug 25, 2017  9:37 AM Jolayne Hainesaylor, Brittany L wrote: CRM for notification. See Telephone encounter for:   08/25/17.  Patient said that she is going to be flying & wants to know can she just have 2 pills, one to get her there & one for on the way. She said something to calm her nerves. Please call back [phone] (H)

## 2017-09-04 ENCOUNTER — Ambulatory Visit
Admission: RE | Admit: 2017-09-04 | Discharge: 2017-09-04 | Disposition: A | Discharge status: Home / Self Care | Payer: BLUE CROSS/BLUE SHIELD | Source: Ambulatory Visit | Attending: Obstetrics and Gynecology | Admitting: Obstetrics and Gynecology

## 2017-09-04 DIAGNOSIS — Z139 Encounter for screening, unspecified: Secondary | ICD-10-CM

## 2017-09-04 DIAGNOSIS — Z1231 Encounter for screening mammogram for malignant neoplasm of breast: Secondary | ICD-10-CM | POA: Diagnosis not present

## 2017-09-21 DIAGNOSIS — R110 Nausea: Secondary | ICD-10-CM | POA: Diagnosis not present

## 2017-09-21 DIAGNOSIS — J019 Acute sinusitis, unspecified: Secondary | ICD-10-CM | POA: Diagnosis not present

## 2017-12-29 DIAGNOSIS — Z85828 Personal history of other malignant neoplasm of skin: Secondary | ICD-10-CM | POA: Diagnosis not present

## 2017-12-29 DIAGNOSIS — D225 Melanocytic nevi of trunk: Secondary | ICD-10-CM | POA: Diagnosis not present

## 2017-12-29 DIAGNOSIS — D1801 Hemangioma of skin and subcutaneous tissue: Secondary | ICD-10-CM | POA: Diagnosis not present

## 2017-12-29 DIAGNOSIS — L814 Other melanin hyperpigmentation: Secondary | ICD-10-CM | POA: Diagnosis not present

## 2018-03-08 DIAGNOSIS — Z23 Encounter for immunization: Secondary | ICD-10-CM | POA: Diagnosis not present

## 2018-03-15 DIAGNOSIS — Z130 Encounter for screening for diseases of the blood and blood-forming organs and certain disorders involving the immune mechanism: Secondary | ICD-10-CM | POA: Diagnosis not present

## 2018-03-15 DIAGNOSIS — N946 Dysmenorrhea, unspecified: Secondary | ICD-10-CM | POA: Diagnosis not present

## 2018-03-15 DIAGNOSIS — Z1389 Encounter for screening for other disorder: Secondary | ICD-10-CM | POA: Diagnosis not present

## 2018-03-15 DIAGNOSIS — Z01419 Encounter for gynecological examination (general) (routine) without abnormal findings: Secondary | ICD-10-CM | POA: Diagnosis not present

## 2018-03-15 DIAGNOSIS — Z124 Encounter for screening for malignant neoplasm of cervix: Secondary | ICD-10-CM | POA: Diagnosis not present

## 2018-03-15 DIAGNOSIS — N841 Polyp of cervix uteri: Secondary | ICD-10-CM | POA: Diagnosis not present

## 2018-03-16 DIAGNOSIS — Z124 Encounter for screening for malignant neoplasm of cervix: Secondary | ICD-10-CM | POA: Diagnosis not present

## 2018-04-13 DIAGNOSIS — Z01419 Encounter for gynecological examination (general) (routine) without abnormal findings: Secondary | ICD-10-CM | POA: Diagnosis not present

## 2018-04-13 DIAGNOSIS — Z124 Encounter for screening for malignant neoplasm of cervix: Secondary | ICD-10-CM | POA: Diagnosis not present

## 2018-04-30 DIAGNOSIS — D649 Anemia, unspecified: Secondary | ICD-10-CM | POA: Diagnosis not present

## 2018-05-05 ENCOUNTER — Ambulatory Visit (HOSPITAL_COMMUNITY)
Admission: EM | Admit: 2018-05-05 | Discharge: 2018-05-05 | Disposition: A | Discharge status: Home / Self Care | Payer: BLUE CROSS/BLUE SHIELD | Attending: Family Medicine | Admitting: Family Medicine

## 2018-05-05 ENCOUNTER — Encounter (HOSPITAL_COMMUNITY): Payer: Self-pay | Admitting: Emergency Medicine

## 2018-05-05 DIAGNOSIS — R0602 Shortness of breath: Secondary | ICD-10-CM | POA: Diagnosis not present

## 2018-05-05 DIAGNOSIS — K219 Gastro-esophageal reflux disease without esophagitis: Secondary | ICD-10-CM | POA: Diagnosis not present

## 2018-05-05 MED ORDER — ALUM & MAG HYDROXIDE-SIMETH 200-200-20 MG/5ML PO SUSP
ORAL | Status: AC
  Filled 2018-05-05: qty 30

## 2018-05-05 MED ORDER — ALUM & MAG HYDROXIDE-SIMETH 200-200-20 MG/5ML PO SUSP
30.0000 mL | Freq: Once | ORAL | Status: AC
  Administered 2018-05-05: 30 mL via ORAL

## 2018-05-05 MED ORDER — LIDOCAINE VISCOUS HCL 2 % MT SOLN
15.0000 mL | Freq: Once | OROMUCOSAL | Status: AC
  Administered 2018-05-05: 15 mL via ORAL

## 2018-05-05 MED ORDER — LIDOCAINE VISCOUS HCL 2 % MT SOLN
OROMUCOSAL | Status: AC
  Filled 2018-05-05: qty 15

## 2018-05-05 MED ORDER — OMEPRAZOLE 20 MG PO CPDR: 20.0000 mg | DELAYED_RELEASE_CAPSULE | Freq: Every day | ORAL | 0 refills | Status: DC

## 2018-05-05 NOTE — Discharge Instructions (Addendum)
Your EKG was normal Please begin taking Prilosec daily for the next 2 weeks Please follow-up with gastroenterology for further evaluation of discomfort in throat/difficulty swallowing  Please monitor your shortness of breath and chest discomfort and follow-up if symptoms changing, worsening, developing difficulty breathing, dizziness, lightheadedness

## 2018-05-05 NOTE — ED Triage Notes (Signed)
Pt c/o sob x3 weeks, states her voice comes and goes. States her hands started tingling for the last few days. Pt also c/o indigestion x1 month.

## 2018-05-06 NOTE — ED Provider Notes (Signed)
[facility]    CSN: [id number] Arrival date & time: 05/05/18  1429     History   Chief Complaint Chief Complaint  Patient presents with  . Appointment    240  . Shortness of Breath    HPI Jo Cohen is a 47 y.o. female no contributing past medical history presenting today for evaluation of worsening indigestion and shortness of breath.  Patient states that she has had indigestion off and on over the past year.  Of recently it has become worse.  She is feels a pressure sensation in her chest.  Worsens after meals.  She is also had associated shortness of breath for the past 3 weeks with this.  She has had intermittent hoarseness and a sensation of difficulty swallowing.  She is used over-the-counter acid relievers with minimal relief.  She denies associated cough or recent illness.  Denies fevers.  Denies leg pain or leg swelling.  Denies previous DVT/PE.  Denies recent travel, immobilization, hospitalization or surgery.  Patient is not on oral contraceptives or exogenous estrogen.  Denies history of hypertension, diabetes, family member early death from MI.  Denies nausea, vomiting or abdominal pain.  Denies tobacco use.  HPI  Past Medical History:  Diagnosis Date  . Hemorrhoids     There are no active problems to display for this patient.   Past Surgical History:  Procedure Laterality Date  . APPENDECTOMY  2007  . CESAREAN SECTION  1998    OB History   None      Home Medications    Prior to Admission medications   Medication Sig Start Date End Date Taking? Authorizing Provider  Calcium Carbonate-Vitamin D (CALCIUM + D PO) Take by mouth daily.    [provider]  ferrous sulfate 325 (65 FE) MG tablet Take 325 mg by mouth daily with breakfast.    [provider]  Multiple Vitamin (MULTIVITAMIN) tablet Take 1 tablet by mouth daily.    [provider]  omeprazole (PRILOSEC) 20 MG capsule Take 1 capsule (20 mg total) by mouth  daily. 05/05/18   , Junius Creamer, PA-C    Family History History reviewed. No pertinent family history.  Social History Social History   Tobacco Use  . Smoking status: Never Smoker  . Smokeless tobacco: Never Used  Substance Use Topics  . Alcohol use: Yes    Alcohol/week: 0.0 standard drinks    Comment: 1 or 2 per month  . Drug use: No     Allergies   Patient has no known allergies.   Review of Systems Review of Systems  Constitutional: Negative for activity change, appetite change, chills, fatigue and fever.  HENT: Positive for trouble swallowing and voice change. Negative for congestion, ear pain, rhinorrhea, sinus pressure and sore throat.   Eyes: Negative for discharge and redness.  Respiratory: Positive for shortness of breath. Negative for cough and chest tightness.   Cardiovascular: Positive for chest pain.  Gastrointestinal: Negative for abdominal pain, diarrhea, nausea and vomiting.  Musculoskeletal: Negative for myalgias.  Skin: Negative for rash.  Neurological: Negative for dizziness, light-headedness and headaches.     Physical Exam Triage Vital Signs ED Triage Vitals  Enc Vitals Group     BP 05/05/18 1453 115/76     Pulse Rate 05/05/18 1453 81     Resp 05/05/18 1453 16     Temp 05/05/18 1453 98.1 F (36.7 C)     Temp src --  SpO2 05/05/18 1453 100 %     Weight --      Height --      Head Circumference --      Peak Flow --      Pain Score 05/05/18 1454 0     Pain Loc --      Pain Edu? --      Excl. in GC? --    No data found.  Updated Vital Signs BP 115/76   Pulse 81   Temp 98.1 F (36.7 C)   Resp 16   LMP 04/14/2018   SpO2 100%   Visual Acuity Right Eye Distance:   Left Eye Distance:   Bilateral Distance:    Right Eye Near:   Left Eye Near:    Bilateral Near:     Physical Exam  Constitutional: She is oriented to person, place, and time. She appears well-developed and well-nourished. No distress.  HENT:  Head:  Normocephalic and atraumatic.  Mouth/Throat: Oropharynx is clear and moist.  Oral mucosa pink and moist, no tonsillar enlargement or exudate. Posterior pharynx patent and nonerythematous, no uvula deviation or swelling. Normal phonation.   Eyes: Pupils are equal, round, and reactive to light. Conjunctivae and EOM are normal.  Neck: Neck supple.  Cardiovascular: Normal rate and regular rhythm.  No murmur heard. Pulmonary/Chest: Effort normal and breath sounds normal. No respiratory distress. She exhibits no tenderness.  Breathing comfortably at rest, CTABL, no wheezing, rales or other adventitious sounds auscultated  Nontender anteriorly to palpation  Abdominal: Soft. There is no tenderness.  Nontender to light and deep palpation throughout abdomen  Musculoskeletal: She exhibits no edema.  No lower extremity swelling or erythema, no calf tenderness bilaterally  Neurological: She is alert and oriented to person, place, and time.  Skin: Skin is warm and dry.  Psychiatric: She has a normal mood and affect.  Nursing note and vitals reviewed.    UC Treatments / Results  Labs (all labs ordered are listed, but only abnormal results are displayed) Labs Reviewed - No data to display  EKG None  Radiology No results found.  Procedures Procedures (including critical care time)  Medications Ordered in UC Medications  alum & mag hydroxide-simeth (MAALOX/MYLANTA) 200-200-20 MG/5ML suspension 30 mL (30 mLs Oral Given 05/05/18 1601)    And  lidocaine (XYLOCAINE) 2 % viscous mouth solution 15 mL (15 mLs Oral Given 05/05/18 1601)    Initial Impression / Assessment and Plan / UC Course  I have reviewed the triage vital signs and the nursing notes.  Pertinent labs & imaging results that were available during my care of the patient were reviewed by me and considered in my medical decision making (see chart for details).     EKG sinus tachycardia at 102, no signs of ischemia or infarction.   This was taken right after given GI cocktail.  Heart rate stabilized down at 83 afterward.  Negative risk factors for MI.  PERC negative.  Slight improvement in chest discomfort after GI cocktail.  Will provide trial of Prilosec for 2 weeks.  Follow-up with gastroenterology given discomfort in throat and difficulty swallowing.Discussed strict return precautions. Patient verbalized understanding and is agreeable with plan.  Final Clinical Impressions(s) / UC Diagnoses   Final diagnoses:  Gastroesophageal reflux disease, esophagitis presence not specified     Discharge Instructions     Your EKG was normal Please begin taking Prilosec daily for the next 2 weeks Please follow-up with gastroenterology for further evaluation of discomfort  in throat/difficulty swallowing  Please monitor your shortness of breath and chest discomfort and follow-up if symptoms changing, worsening, developing difficulty breathing, dizziness, lightheadedness   ED Prescriptions    Medication Sig Dispense Auth. Provider   omeprazole (PRILOSEC) 20 MG capsule Take 1 capsule (20 mg total) by mouth daily. 20 capsule ,  C, PA-C     Controlled Substance Prescriptions [place] Controlled Substance Registry consulted? Not Applicable   Lew Dawes,  C, [place] 05/06/18 1021

## 2018-05-07 DIAGNOSIS — M79629 Pain in unspecified upper arm: Secondary | ICD-10-CM | POA: Diagnosis not present

## 2018-05-10 ENCOUNTER — Ambulatory Visit (INDEPENDENT_AMBULATORY_CARE_PROVIDER_SITE_OTHER): Payer: BLUE CROSS/BLUE SHIELD | Admitting: Family Medicine

## 2018-05-10 ENCOUNTER — Ambulatory Visit (INDEPENDENT_AMBULATORY_CARE_PROVIDER_SITE_OTHER)
Admission: RE | Admit: 2018-05-10 | Discharge: 2018-05-10 | Disposition: A | Discharge status: Home / Self Care | Payer: BLUE CROSS/BLUE SHIELD | Source: Ambulatory Visit | Attending: Family Medicine | Admitting: Family Medicine

## 2018-05-10 ENCOUNTER — Encounter: Payer: Self-pay | Admitting: Family Medicine

## 2018-05-10 VITALS — BP 114/70 | HR 90 | Temp 98.5°F | Ht 66.5 in | Wt 144.0 lb

## 2018-05-10 DIAGNOSIS — K6289 Other specified diseases of anus and rectum: Secondary | ICD-10-CM | POA: Insufficient documentation

## 2018-05-10 DIAGNOSIS — K219 Gastro-esophageal reflux disease without esophagitis: Secondary | ICD-10-CM

## 2018-05-10 DIAGNOSIS — D509 Iron deficiency anemia, unspecified: Secondary | ICD-10-CM | POA: Diagnosis not present

## 2018-05-10 DIAGNOSIS — N920 Excessive and frequent menstruation with regular cycle: Secondary | ICD-10-CM | POA: Insufficient documentation

## 2018-05-10 DIAGNOSIS — N930 Postcoital and contact bleeding: Secondary | ICD-10-CM | POA: Insufficient documentation

## 2018-05-10 DIAGNOSIS — R0602 Shortness of breath: Secondary | ICD-10-CM

## 2018-05-10 DIAGNOSIS — N944 Primary dysmenorrhea: Secondary | ICD-10-CM | POA: Insufficient documentation

## 2018-05-10 LAB — COMPREHENSIVE METABOLIC PANEL
ALT: 17 U/L (ref 0–35)
AST: 18 U/L (ref 0–37)
Albumin: 4.4 g/dL (ref 3.5–5.2)
Alkaline Phosphatase: 49 U/L (ref 39–117)
BUN: 23 mg/dL (ref 6–23)
CO2: 27 mEq/L (ref 19–32)
Calcium: 9.6 mg/dL (ref 8.4–10.5)
Chloride: 103 mEq/L (ref 96–112)
Creatinine, Ser: 0.85 mg/dL (ref 0.40–1.20)
GFR: 75.62 mL/min (ref >60.00)
Glucose, Bld: 99 mg/dL (ref 70–99)
Potassium: 4.3 mEq/L (ref 3.5–5.1)
Sodium: 136 mEq/L (ref 135–145)
Total Bilirubin: 0.6 mg/dL (ref 0.2–1.2)
Total Protein: 7.1 g/dL (ref 6.0–8.3)

## 2018-05-10 LAB — FERRITIN: Ferritin: 21.7 ng/mL (ref 10.0–291.0)

## 2018-05-10 LAB — CBC WITH DIFFERENTIAL/PLATELET
Basophils Absolute: 0.1 10*3/uL (ref 0.0–0.1)
Basophils Relative: 1 % (ref 0.0–3.0)
Eosinophils Absolute: 0.1 10*3/uL (ref 0.0–0.7)
Eosinophils Relative: 1.8 % (ref 0.0–5.0)
HCT: 43.5 % (ref 36.0–46.0)
Hemoglobin: 14.3 g/dL (ref 12.0–15.0)
Lymphocytes Relative: 18.8 % (ref 12.0–46.0)
Lymphs Abs: 1.5 10*3/uL (ref 0.7–4.0)
MCHC: 32.9 g/dL (ref 30.0–36.0)
MCV: 88.6 fl (ref 78.0–100.0)
Monocytes Absolute: 0.9 10*3/uL (ref 0.1–1.0)
Monocytes Relative: 11 % (ref 3.0–12.0)
Neutro Abs: 5.3 10*3/uL (ref 1.4–7.7)
Neutrophils Relative %: 67.4 % (ref 43.0–77.0)
Platelets: 270 10*3/uL (ref 150.0–400.0)
RBC: 4.91 Mil/uL (ref 3.87–5.11)
RDW: 21.9 % — ABNORMAL HIGH (ref 11.5–15.5)
WBC: 7.9 10*3/uL (ref 4.0–10.5)

## 2018-05-10 LAB — TSH: TSH: 1.14 u[IU]/mL (ref 0.35–4.50)

## 2018-05-10 MED ORDER — OMEPRAZOLE 20 MG PO CPDR: 20.0000 mg | DELAYED_RELEASE_CAPSULE | Freq: Every day | ORAL | 0 refills | Status: DC

## 2018-05-10 NOTE — Progress Notes (Signed)
Subjective:    Patient ID: Jo Cohen, female    DOB: 1971/06/04, 47 y.o.   MRN: [id number]  HPI This is a 47 yo female who presents today for follow up of recent UC visit. Was seen 05/05/18 with increasing indigestion and SOB. She had negative EKG and improvement with GI cocktail and was discharged to follow up with PCP.  SOB- feels like she can't take a deep breath, feels out of breath with talking to people. This has been happening for at least a month. Her voices changes, and she noticed difficulty with swallowing her saliva but not with food.  At [facility], she was put on omeprazole which seems to help some with reflux but not with breathing or hoarseness. She has taken 4 doses.  Does a boot camp twice a week, has noticed that her breathing is "a little off" but she is able to complete her work outs.  Sleeping without difficulty. Notices symptoms if slouching.  No sore throat or pain with swallowing.  Has an appointemnt with Dr. Ewing Schlein 06/15/18.  No nausea or vomiting, no history of asthma, no fever/chills, no symptoms of viral illness, no bowel or bladder symptoms, no muscle aches or pains. No wheeze or cough. No recent or remote history of asthma or occupation/environmental exposure of chemicals.  Is on iron for anemia from gyn, hgb low. Has heavy periods. Had recent labs checked.   Past Medical History:  Diagnosis Date  . Hemorrhoids    Past Surgical History:  Procedure Laterality Date  . APPENDECTOMY  2007  . CESAREAN SECTION  1998   No family history on file. Social History   Tobacco Use  . Smoking status: Never Smoker  . Smokeless tobacco: Never Used  Substance Use Topics  . Alcohol use: Yes    Alcohol/week: 0.0 standard drinks    Comment: 1 or 2 per month  . Drug use: No      Review of Systems Per HPI    Objective:   Physical Exam  Constitutional: She is oriented to person, place, and time. She appears well-developed and well-nourished. No distress.  HENT:    Head: Normocephalic and atraumatic.  Nose: Nose normal.  Mouth/Throat: Oropharynx is clear and moist.  Voice occasionally sounds mildly hoarse.   Eyes: Conjunctivae are normal.  Neck: Normal range of motion. Neck supple. No thyromegaly present.  Cardiovascular: Normal rate, regular rhythm and normal heart sounds.  Pulmonary/Chest: Effort normal and breath sounds normal. No stridor. No respiratory distress. She has no wheezes. She has no rales.  Musculoskeletal: She exhibits no edema.  Lymphadenopathy:    She has no cervical adenopathy.  Neurological: She is alert and oriented to person, place, and time.  Skin: Skin is warm and dry. She is not diaphoretic.  Psychiatric: She has a normal mood and affect. Her behavior is normal. Judgment and thought content normal.  Vitals reviewed.     BP 114/70 (BP Location: Right Arm, Patient Position: Sitting, Cuff Size: Normal)   Pulse 90   Temp 98.5 F (36.9 C) (Oral)   Ht 5' 6.5" (1.689 m)   Wt 144 lb (65.3 kg)   LMP 04/14/2018   SpO2 98%   BMI 22.89 kg/m  Wt Readings from Last 3 Encounters:  05/10/18 144 lb (65.3 kg)  07/18/15 137 lb (62.1 kg)  03/16/15 135 lb (61.2 kg)       Assessment & Plan:  1. Iron deficiency anemia, unspecified iron deficiency anemia type - CBC with  Differential - Ferritin - Iron and TIBC  2. SOB (shortness of breath) - unclear etiology, patient reports significant symptoms, but is able to perform daily activities including intense exercise. Will check labs and CXR - TSH - Comprehensive metabolic panel - DG Chest 2 View; Future  3. Gastroesophageal reflux disease without esophagitis - follow up as scheduled with GI - omeprazole (PRILOSEC) 20 MG capsule; Take 1 capsule (20 mg total) by mouth daily.  Dispense: 90 capsule; Refill: 0   Olean Reeeborah Gessner, FNP-BC  [facility], [facility]  05/10/2018 8:34 PM

## 2018-05-10 NOTE — Patient Instructions (Signed)
Good to see you today  I will call you with your lab and xray reports  IF worsening symptoms prior to GI appointment, please follow up

## 2018-05-18 ENCOUNTER — Ambulatory Visit: Payer: BLUE CROSS/BLUE SHIELD | Admitting: Family Medicine

## 2018-06-15 DIAGNOSIS — K210 Gastro-esophageal reflux disease with esophagitis: Secondary | ICD-10-CM | POA: Diagnosis not present

## 2018-06-26 DIAGNOSIS — J0100 Acute maxillary sinusitis, unspecified: Secondary | ICD-10-CM | POA: Diagnosis not present

## 2018-08-19 ENCOUNTER — Other Ambulatory Visit: Payer: Self-pay | Admitting: Obstetrics and Gynecology

## 2018-08-19 DIAGNOSIS — Z1231 Encounter for screening mammogram for malignant neoplasm of breast: Secondary | ICD-10-CM

## 2018-09-15 ENCOUNTER — Ambulatory Visit: Payer: BLUE CROSS/BLUE SHIELD

## 2018-10-25 ENCOUNTER — Telehealth: Payer: Self-pay | Admitting: Family Medicine

## 2018-10-25 NOTE — Telephone Encounter (Signed)
OK with me, she is a very nice patient

## 2018-10-25 NOTE — Telephone Encounter (Signed)
Best number [phone] Pt would like to switch pcp from dr copland to [facility].  All her family see debbie  Ok to switch??

## 2018-10-25 NOTE — Telephone Encounter (Signed)
That is fine with me.

## 2018-10-26 NOTE — Telephone Encounter (Signed)
That is fine 

## 2018-10-26 NOTE — Telephone Encounter (Signed)
Jo Cohen I spoke with pt to schedule transfer of care appointment.  Pt wanted to schedule a physical with labs prior.  She didn't want to schedule transfer of care appointment.  Is it ok to schedule physical with labs prior?? Thanks

## 2018-10-28 NOTE — Telephone Encounter (Signed)
Labs 6/18 cpx 6/24

## 2018-11-04 ENCOUNTER — Other Ambulatory Visit: Payer: Self-pay

## 2018-11-04 ENCOUNTER — Ambulatory Visit
Admission: RE | Admit: 2018-11-04 | Discharge: 2018-11-04 | Disposition: A | Discharge status: Home / Self Care | Payer: BLUE CROSS/BLUE SHIELD | Source: Ambulatory Visit | Attending: Obstetrics and Gynecology | Admitting: Obstetrics and Gynecology

## 2018-11-04 DIAGNOSIS — Z1231 Encounter for screening mammogram for malignant neoplasm of breast: Secondary | ICD-10-CM | POA: Diagnosis not present

## 2018-11-29 ENCOUNTER — Other Ambulatory Visit: Payer: Self-pay | Admitting: Family Medicine

## 2018-11-29 DIAGNOSIS — Z13220 Encounter for screening for lipoid disorders: Secondary | ICD-10-CM

## 2018-11-29 DIAGNOSIS — D509 Iron deficiency anemia, unspecified: Secondary | ICD-10-CM

## 2018-12-02 ENCOUNTER — Other Ambulatory Visit: Payer: Self-pay

## 2018-12-02 ENCOUNTER — Other Ambulatory Visit (INDEPENDENT_AMBULATORY_CARE_PROVIDER_SITE_OTHER): Payer: BC Managed Care – PPO

## 2018-12-02 DIAGNOSIS — D509 Iron deficiency anemia, unspecified: Secondary | ICD-10-CM

## 2018-12-02 DIAGNOSIS — Z13220 Encounter for screening for lipoid disorders: Secondary | ICD-10-CM | POA: Diagnosis not present

## 2018-12-02 LAB — LIPID PANEL
Cholesterol: 208 mg/dL — ABNORMAL HIGH (ref 0–200)
HDL: 73.9 mg/dL (ref >39.00)
LDL Cholesterol: 123 mg/dL — ABNORMAL HIGH (ref 0–99)
NonHDL: 134.1
Total CHOL/HDL Ratio: 3
Triglycerides: 55 mg/dL (ref 0.0–149.0)
VLDL: 11 mg/dL (ref 0.0–40.0)

## 2018-12-02 LAB — CBC WITH DIFFERENTIAL/PLATELET
Basophils Absolute: 0.1 10*3/uL (ref 0.0–0.1)
Basophils Relative: 1.2 % (ref 0.0–3.0)
Eosinophils Absolute: 0.2 10*3/uL (ref 0.0–0.7)
Eosinophils Relative: 3.4 % (ref 0.0–5.0)
HCT: 42.1 % (ref 36.0–46.0)
Hemoglobin: 13.7 g/dL (ref 12.0–15.0)
Lymphocytes Relative: 21.3 % (ref 12.0–46.0)
Lymphs Abs: 1.1 10*3/uL (ref 0.7–4.0)
MCHC: 32.6 g/dL (ref 30.0–36.0)
MCV: 89.4 fl (ref 78.0–100.0)
Monocytes Absolute: 0.7 10*3/uL (ref 0.1–1.0)
Monocytes Relative: 12.6 % — ABNORMAL HIGH (ref 3.0–12.0)
Neutro Abs: 3.3 10*3/uL (ref 1.4–7.7)
Neutrophils Relative %: 61.5 % (ref 43.0–77.0)
Platelets: 286 10*3/uL (ref 150.0–400.0)
RBC: 4.7 Mil/uL (ref 3.87–5.11)
RDW: 13.8 % (ref 11.5–15.5)
WBC: 5.3 10*3/uL (ref 4.0–10.5)

## 2018-12-02 LAB — BASIC METABOLIC PANEL
BUN: 17 mg/dL (ref 6–23)
CO2: 29 mEq/L (ref 19–32)
Calcium: 9.1 mg/dL (ref 8.4–10.5)
Chloride: 103 mEq/L (ref 96–112)
Creatinine, Ser: 0.9 mg/dL (ref 0.40–1.20)
GFR: 66.45 mL/min (ref >60.00)
Glucose, Bld: 90 mg/dL (ref 70–99)
Potassium: 3.9 mEq/L (ref 3.5–5.1)
Sodium: 137 mEq/L (ref 135–145)

## 2018-12-06 ENCOUNTER — Encounter: Payer: BLUE CROSS/BLUE SHIELD | Admitting: Family Medicine

## 2018-12-08 ENCOUNTER — Encounter: Payer: Self-pay | Admitting: Family Medicine

## 2018-12-08 ENCOUNTER — Ambulatory Visit (INDEPENDENT_AMBULATORY_CARE_PROVIDER_SITE_OTHER): Payer: BC Managed Care – PPO | Admitting: Family Medicine

## 2018-12-08 ENCOUNTER — Other Ambulatory Visit: Payer: Self-pay

## 2018-12-08 VITALS — BP 104/62 | HR 74 | Temp 99.1°F | Ht 66.5 in | Wt 141.8 lb

## 2018-12-08 DIAGNOSIS — Z0000 Encounter for general adult medical examination without abnormal findings: Secondary | ICD-10-CM | POA: Diagnosis not present

## 2018-12-08 DIAGNOSIS — N939 Abnormal uterine and vaginal bleeding, unspecified: Secondary | ICD-10-CM

## 2018-12-08 NOTE — Patient Instructions (Signed)
Good to see you today  Consider talking to your gyn about IUD for heavy periods  Have a great year  Health Maintenance, Female Adopting a healthy lifestyle and getting preventive care can go a long way to promote health and wellness. Talk with your health care provider about what schedule of regular examinations is right for you. This is a good chance for you to check in with your provider about disease prevention and staying healthy. In between checkups, there are plenty of things you can do on your own. Experts have done a lot of research about which lifestyle changes and preventive measures are most likely to keep you healthy. Ask your health care provider for more information. Weight and diet Eat a healthy diet  Be sure to include plenty of vegetables, fruits, low-fat dairy products, and lean protein.  Do not eat a lot of foods high in solid fats, added sugars, or salt.  Get regular exercise. This is one of the most important things you can do for your health. ? Most adults should exercise for at least 150 minutes each week. The exercise should increase your heart rate and make you sweat (moderate-intensity exercise). ? Most adults should also do strengthening exercises at least twice a week. This is in addition to the moderate-intensity exercise. Maintain a healthy weight  Body mass index (BMI) is a measurement that can be used to identify possible weight problems. It estimates body fat based on height and weight. Your health care provider can help determine your BMI and help you achieve or maintain a healthy weight.  For females 51 years of age and older: ? A BMI below 18.5 is considered underweight. ? A BMI of 18.5 to 24.9 is normal. ? A BMI of 25 to 29.9 is considered overweight. ? A BMI of 30 and above is considered obese. Watch levels of cholesterol and blood lipids  You should start having your blood tested for lipids and cholesterol at 49 years of age, then have this test  every 5 years.  You may need to have your cholesterol levels checked more often if: ? Your lipid or cholesterol levels are high. ? You are older than 49 years of age. ? You are at high risk for heart disease. Cancer screening Lung Cancer  Lung cancer screening is recommended for adults 64-74 years old who are at high risk for lung cancer because of a history of smoking.  A yearly low-dose CT scan of the lungs is recommended for people who: ? Currently smoke. ? Have quit within the past 15 years. ? Have at least a 30-pack-year history of smoking. A pack year is smoking an average of one pack of cigarettes a day for 1 year.  Yearly screening should continue until it has been 15 years since you quit.  Yearly screening should stop if you develop a health problem that would prevent you from having lung cancer treatment. Breast Cancer  Practice breast self-awareness. This means understanding how your breasts normally appear and feel.  It also means doing regular breast self-exams. Let your health care provider know about any changes, no matter how small.  If you are in your 20s or 30s, you should have a clinical breast exam (CBE) by a health care provider every 1-3 years as part of a regular health exam.  If you are 54 or older, have a CBE every year. Also consider having a breast X-ray (mammogram) every year.  If you have a family history of  breast cancer, talk to your health care provider about genetic screening.  If you are at high risk for breast cancer, talk to your health care provider about having an MRI and a mammogram every year.  Breast cancer gene (BRCA) assessment is recommended for women who have family members with BRCA-related cancers. BRCA-related cancers include: ? Breast. ? Ovarian. ? Tubal. ? Peritoneal cancers.  Results of the assessment will determine the need for genetic counseling and BRCA1 and BRCA2 testing. Cervical Cancer Your health care provider may  recommend that you be screened regularly for cancer of the pelvic organs (ovaries, uterus, and vagina). This screening involves a pelvic examination, including checking for microscopic changes to the surface of your cervix (Pap test). You may be encouraged to have this screening done every 3 years, beginning at age 60.  For women ages 47-65, health care providers may recommend pelvic exams and Pap testing every 3 years, or they may recommend the Pap and pelvic exam, combined with testing for human papilloma virus (HPV), every 5 years. Some types of HPV increase your risk of cervical cancer. Testing for HPV may also be done on women of any age with unclear Pap test results.  Other health care providers may not recommend any screening for nonpregnant women who are considered low risk for pelvic cancer and who do not have symptoms. Ask your health care provider if a screening pelvic exam is right for you.  If you have had past treatment for cervical cancer or a condition that could lead to cancer, you need Pap tests and screening for cancer for at least 20 years after your treatment. If Pap tests have been discontinued, your risk factors (such as having a new sexual partner) need to be reassessed to determine if screening should resume. Some women have medical problems that increase the chance of getting cervical cancer. In these cases, your health care provider may recommend more frequent screening and Pap tests. Colorectal Cancer  This type of cancer can be detected and often prevented.  Routine colorectal cancer screening usually begins at 49 years of age and continues through 49 years of age.  Your health care provider may recommend screening at an earlier age if you have risk factors for colon cancer.  Your health care provider may also recommend using home test kits to check for hidden blood in the stool.  A small camera at the end of a tube can be used to examine your colon directly  (sigmoidoscopy or colonoscopy). This is done to check for the earliest forms of colorectal cancer.  Routine screening usually begins at age 45.  Direct examination of the colon should be repeated every 5-10 years through 49 years of age. However, you may need to be screened more often if early forms of precancerous polyps or small growths are found. Skin Cancer  Check your skin from head to toe regularly.  Tell your health care provider about any new moles or changes in moles, especially if there is a change in a mole's shape or color.  Also tell your health care provider if you have a mole that is larger than the size of a pencil eraser.  Always use sunscreen. Apply sunscreen liberally and repeatedly throughout the day.  Protect yourself by wearing long sleeves, pants, a wide-brimmed hat, and sunglasses whenever you are outside. Heart disease, diabetes, and high blood pressure  High blood pressure causes heart disease and increases the risk of stroke. High blood pressure is more likely  to develop in: ? People who have blood pressure in the high end of the normal range (130-139/85-89 mm Hg). ? People who are overweight or obese. ? People who are African American.  If you are 59-12 years of age, have your blood pressure checked every 3-5 years. If you are 24 years of age or older, have your blood pressure checked every year. You should have your blood pressure measured twice-once when you are at a hospital or clinic, and once when you are not at a hospital or clinic. Record the average of the two measurements. To check your blood pressure when you are not at a hospital or clinic, you can use: ? An automated blood pressure machine at a pharmacy. ? A home blood pressure monitor.  If you are between 31 years and 38 years old, ask your health care provider if you should take aspirin to prevent strokes.  Have regular diabetes screenings. This involves taking a blood sample to check your  fasting blood sugar level. ? If you are at a normal weight and have a low risk for diabetes, have this test once every three years after 49 years of age. ? If you are overweight and have a high risk for diabetes, consider being tested at a younger age or more often. Preventing infection Hepatitis B  If you have a higher risk for hepatitis B, you should be screened for this virus. You are considered at high risk for hepatitis B if: ? You were born in a country where hepatitis B is common. Ask your health care provider which countries are considered high risk. ? Your parents were born in a high-risk country, and you have not been immunized against hepatitis B (hepatitis B vaccine). ? You have HIV or AIDS. ? You use needles to inject street drugs. ? You live with someone who has hepatitis B. ? You have had sex with someone who has hepatitis B. ? You get hemodialysis treatment. ? You take certain medicines for conditions, including cancer, organ transplantation, and autoimmune conditions. Hepatitis C  Blood testing is recommended for: ? Everyone born from [birth year] through 1966. ? Anyone with known risk factors for hepatitis C. Sexually transmitted infections (STIs)  You should be screened for sexually transmitted infections (STIs) including gonorrhea and chlamydia if: ? You are sexually active and are younger than 50 years of age. ? You are older than 49 years of age and your health care provider tells you that you are at risk for this type of infection. ? Your sexual activity has changed since you were last screened and you are at an increased risk for chlamydia or gonorrhea. Ask your health care provider if you are at risk.  If you do not have HIV, but are at risk, it may be recommended that you take a prescription medicine daily to prevent HIV infection. This is called pre-exposure prophylaxis (PrEP). You are considered at risk if: ? You are sexually active and do not regularly use condoms or  know the HIV status of your partner(s). ? You take drugs by injection. ? You are sexually active with a partner who has HIV. Talk with your health care provider about whether you are at high risk of being infected with HIV. If you choose to begin PrEP, you should first be tested for HIV. You should then be tested every 3 months for as long as you are taking PrEP. Pregnancy  If you are premenopausal and you may become pregnant, ask your  health care provider about preconception counseling.  If you may become pregnant, take 400 to 800 micrograms (mcg) of folic acid every day.  If you want to prevent pregnancy, talk to your health care provider about birth control (contraception). Osteoporosis and menopause  Osteoporosis is a disease in which the bones lose minerals and strength with aging. This can result in serious bone fractures. Your risk for osteoporosis can be identified using a bone density scan.  If you are 22 years of age or older, or if you are at risk for osteoporosis and fractures, ask your health care provider if you should be screened.  Ask your health care provider whether you should take a calcium or vitamin D supplement to lower your risk for osteoporosis.  Menopause may have certain physical symptoms and risks.  Hormone replacement therapy may reduce some of these symptoms and risks. Talk to your health care provider about whether hormone replacement therapy is right for you. Follow these instructions at home:  Schedule regular health, dental, and eye exams.  Stay current with your immunizations.  Do not use any tobacco products including cigarettes, chewing tobacco, or electronic cigarettes.  If you are pregnant, do not drink alcohol.  If you are breastfeeding, limit how much and how often you drink alcohol.  Limit alcohol intake to no more than 1 drink per day for nonpregnant women. One drink equals 12 ounces of beer, 5 ounces of wine, or 1 ounces of hard  liquor.  Do not use street drugs.  Do not share needles.  Ask your health care provider for help if you need support or information about quitting drugs.  Tell your health care provider if you often feel depressed.  Tell your health care provider if you have ever been abused or do not feel safe at home. This information is not intended to replace advice given to you by your health care provider. Make sure you discuss any questions you have with your health care provider. Document Released: 12/16/2010 Document Revised: 11/08/2015 Document Reviewed: 03/06/2015 Elsevier Interactive Patient Education  2019 [employer].

## 2018-12-08 NOTE — Progress Notes (Signed)
Subjective:    Patient ID: Jo Cohen, female    DOB: [birth year], 48 y.o.   MRN: [id number]  HPI This is a 48 yo female who presents today to transfer care from Dr. Lorelei Pont and for CPE. She works from home which has been going well. Got a new puppy 1/20.    Last CPE- 03/15/18- gyn Mammo- 11/04/18 Pap- gyn Tdap- unsure, does not wish to get today Flu- regular Eye- regular Dental- regular Exercise- walks  Past Medical History:  Diagnosis Date  . Hemorrhoids    Past Surgical History:  Procedure Laterality Date  . APPENDECTOMY  2007  . CESAREAN SECTION  1998   No family history on file. Social History   Tobacco Use  . Smoking status: Never Smoker  . Smokeless tobacco: Never Used  Substance Use Topics  . Alcohol use: Yes    Alcohol/week: 0.0 standard drinks    Comment: 1 or 2 per month  . Drug use: No      Review of Systems  Constitutional: Negative.   HENT: Negative.   Eyes: Negative.   Respiratory: Negative.   Cardiovascular: Negative.   Gastrointestinal: Negative.   Genitourinary: Positive for menstrual problem (heavy periods, cramping. ).  Musculoskeletal: Negative.   Skin: Negative.   Allergic/Immunologic: Negative.   Neurological: Negative.   Hematological: Negative.   Psychiatric/Behavioral: Negative.        Objective:   Physical Exam Physical Exam  Constitutional: She is oriented to person, place, and time. She appears well-developed and well-nourished. No distress.  HENT:  Head: Normocephalic and atraumatic.  Right Ear: External ear normal.  Left Ear: External ear normal.  Nose: Nose normal.  Mouth/Throat: Oropharynx is clear and moist. No oropharyngeal exudate.  Eyes: Conjunctivae are normal. Pupils are equal, round, and reactive to light.  Neck: Normal range of motion. Neck supple. No JVD present. No thyromegaly present.  Cardiovascular: Normal rate, regular rhythm, normal heart sounds and intact distal pulses.   Pulmonary/Chest:  Effort normal and breath sounds normal. Right breast exhibits no inverted nipple, no mass, no nipple discharge, no skin change and no tenderness. Left breast exhibits no inverted nipple, no mass, no nipple discharge, no skin change and no tenderness. Breasts are symmetrical.  Abdominal: Soft. Bowel sounds are normal. She exhibits no distension and no mass. There is no tenderness. There is no rebound and no guarding.  Musculoskeletal: Normal range of motion. She exhibits no edema or tenderness.  Lymphadenopathy:    She has no cervical adenopathy.  Neurological: She is alert and oriented to person, place, and time. She has normal reflexes.  Skin: Skin is warm and dry. She is not diaphoretic.  Psychiatric: She has a normal mood and affect. Her behavior is normal. Judgment and thought content normal.  Vitals reviewed.  BP 104/62 (BP Location: Left Arm, Patient Position: Sitting, Cuff Size: Normal)   Pulse 74   Temp 99.1 F (37.3 C) (Tympanic)   Ht 5' 6.5" (1.689 m)   Wt 141 lb 12 oz (64.3 kg)   LMP 12/01/2018   SpO2 96%   BMI 22.54 kg/m  Wt Readings from Last 3 Encounters:  12/08/18 141 lb 12 oz (64.3 kg)  05/10/18 144 lb (65.3 kg)  07/18/15 137 lb (62.1 kg)    Depression screen PHQ 2/9 12/08/2018  Decreased Interest 0  Down, Depressed, Hopeless 0  PHQ - 2 Score 0       Assessment & Plan:  1. Annual physical exam - Discussed  and encouraged healthy lifestyle choices- adequate sleep, regular exercise, stress management and healthy food choices.    2. Abnormal uterine bleeding (AUB) - reviewed options for treatment, she will consider and discuss further with her gyn at upcoming appointment   Olean Reeeborah Jimya Ciani, FNP-BC  [facility], [facility]  12/12/2018 11:33 AM

## 2018-12-12 ENCOUNTER — Encounter: Payer: Self-pay | Admitting: Family Medicine

## 2019-01-03 DIAGNOSIS — D225 Melanocytic nevi of trunk: Secondary | ICD-10-CM | POA: Diagnosis not present

## 2019-01-03 DIAGNOSIS — L821 Other seborrheic keratosis: Secondary | ICD-10-CM | POA: Diagnosis not present

## 2019-01-03 DIAGNOSIS — L814 Other melanin hyperpigmentation: Secondary | ICD-10-CM | POA: Diagnosis not present

## 2019-01-03 DIAGNOSIS — L565 Disseminated superficial actinic porokeratosis (DSAP): Secondary | ICD-10-CM | POA: Diagnosis not present

## 2019-01-05 DIAGNOSIS — J029 Acute pharyngitis, unspecified: Secondary | ICD-10-CM | POA: Diagnosis not present

## 2019-01-05 DIAGNOSIS — J309 Allergic rhinitis, unspecified: Secondary | ICD-10-CM | POA: Diagnosis not present

## 2019-01-31 ENCOUNTER — Encounter: Payer: Self-pay | Admitting: Family Medicine

## 2019-01-31 ENCOUNTER — Other Ambulatory Visit: Payer: Self-pay

## 2019-01-31 ENCOUNTER — Ambulatory Visit (INDEPENDENT_AMBULATORY_CARE_PROVIDER_SITE_OTHER): Payer: BC Managed Care – PPO | Admitting: Family Medicine

## 2019-01-31 VITALS — Ht 66.5 in | Wt 142.0 lb

## 2019-01-31 DIAGNOSIS — J358 Other chronic diseases of tonsils and adenoids: Secondary | ICD-10-CM

## 2019-01-31 NOTE — Progress Notes (Signed)
Virtual Visit via Video Note  I connected with Jo Cohen on 01/31/19 at  9:15 AM EDT by a video enabled telemedicine application and verified that I am speaking with the correct person using two identifiers.  Location: Patient: Jo Cohen Provider: [place]   I discussed the limitations of evaluation and management by telemedicine and the availability of Jo person appointments. The patient expressed understanding and agreed to proceed.  History of Present Illness: Chief Complaint  Patient presents with  . Sore Throat    started about 3 days ago. Pt states that she has a white patch on the left side of her throat. Throat is not sore this morning but she feels like something is "stuck Jo there". Pt notes some hoarseness. Throat clearing. Started using Azelastine NA spray over when throat began hurting.    This is a 48 yo female who requests virtual visit for the above chief complaint.  She has had a sensation of something on the left side of her throat for 3 days.  She does not have pain Jo her throat nor difficulty swallowing.  She sees some type of a white patch there.  She feels that the patch is Jo a crevice as opposed to sitting on top of her tonsil.  She has not had any known exposures to COVID-19 nor strep throat.  She has not had runny nose, cough, shortness of breath, wheeze.  She did have some left ear pressure and sore throat 3 to 4 weeks ago and was seen at [facility] where they per start prescribed her Astelin nasal spray which she took with good relief of symptoms.  Past Medical History:  Diagnosis Date  . Hemorrhoids    Past Surgical History:  Procedure Laterality Date  . APPENDECTOMY  2007  . [place]   History reviewed. No pertinent family history. Social History   Tobacco Use  . Smoking status: Never Smoker  . Smokeless tobacco: Never Used  Substance Use Topics  . Alcohol use: Yes    Alcohol/week: 0.0 standard drinks   Comment: 1 or 2 per month  . Drug use: No      Observations/Objective: Physical Exam  Constitutional: She is oriented to person, place, and time. No distress.  HENT:  Head: Normocephalic and atraumatic.  Mouth/Throat: Mucous membranes are normal. Oropharyngeal exudate (left tonsil) present. No posterior oropharyngeal edema, posterior oropharyngeal erythema or tonsillar abscesses.  Eyes: Conjunctivae are normal.  Neurological: She is alert and oriented to person, place, and time.  Skin: She is not diaphoretic.  Psychiatric: Mood, memory, affect and judgment normal.    Ht 5' 6.5" (1.689 m)   Wt 142 lb (64.4 kg)   BMI 22.58 kg/m   Assessment and Plan: 1. Tonsil stone -Jo the absence of pain, erythema, difficulty swallowing I do not suspect viral or bacterial infection. -Provided her instructions for gargles with warm salt water and she can try to gently remove area with Cohen swab or toothbrush. - RTC precautions reviewed, fever/chills, pain, difficulty swallowing   Clarene Reamer, FNP-BC  [facility] at [facility]  01/31/2019 9:30 AM   Follow Up Instructions: Visit recap sent to patient via my chart   I discussed the assessment and treatment plan with the patient. The patient was provided an opportunity to ask questions and all were answered. The patient agreed with the plan and demonstrated an understanding of the instructions.   The patient  was advised to call back or seek an Jo-person evaluation if the symptoms worsen or if the condition fails to improve as anticipated.    [name], FNP

## 2019-03-16 DIAGNOSIS — N841 Polyp of cervix uteri: Secondary | ICD-10-CM | POA: Diagnosis not present

## 2019-03-16 DIAGNOSIS — Z124 Encounter for screening for malignant neoplasm of cervix: Secondary | ICD-10-CM | POA: Diagnosis not present

## 2019-03-16 DIAGNOSIS — Z01419 Encounter for gynecological examination (general) (routine) without abnormal findings: Secondary | ICD-10-CM | POA: Diagnosis not present

## 2019-03-16 DIAGNOSIS — Z130 Encounter for screening for diseases of the blood and blood-forming organs and certain disorders involving the immune mechanism: Secondary | ICD-10-CM | POA: Diagnosis not present

## 2019-03-16 DIAGNOSIS — Z1389 Encounter for screening for other disorder: Secondary | ICD-10-CM | POA: Diagnosis not present

## 2019-03-22 ENCOUNTER — Other Ambulatory Visit: Payer: Self-pay

## 2019-03-22 DIAGNOSIS — Z20828 Contact with and (suspected) exposure to other viral communicable diseases: Secondary | ICD-10-CM | POA: Diagnosis not present

## 2019-03-22 DIAGNOSIS — Z20822 Contact with and (suspected) exposure to covid-19: Secondary | ICD-10-CM

## 2019-03-24 LAB — NOVEL CORONAVIRUS, NAA: SARS-CoV-2, NAA: NOT DETECTED

## 2019-06-22 ENCOUNTER — Ambulatory Visit: Payer: BC Managed Care – PPO | Attending: Internal Medicine

## 2019-06-22 DIAGNOSIS — Z20822 Contact with and (suspected) exposure to covid-19: Secondary | ICD-10-CM

## 2019-06-24 LAB — NOVEL CORONAVIRUS, NAA: SARS-CoV-2, NAA: NOT DETECTED

## 2019-07-27 DIAGNOSIS — Z1211 Encounter for screening for malignant neoplasm of colon: Secondary | ICD-10-CM | POA: Diagnosis not present

## 2019-10-03 ENCOUNTER — Other Ambulatory Visit: Payer: Self-pay | Admitting: Obstetrics and Gynecology

## 2019-10-03 DIAGNOSIS — Z1231 Encounter for screening mammogram for malignant neoplasm of breast: Secondary | ICD-10-CM

## 2019-10-20 DIAGNOSIS — Z1159 Encounter for screening for other viral diseases: Secondary | ICD-10-CM | POA: Diagnosis not present

## 2019-10-25 DIAGNOSIS — K219 Gastro-esophageal reflux disease without esophagitis: Secondary | ICD-10-CM | POA: Diagnosis not present

## 2019-10-25 DIAGNOSIS — K449 Diaphragmatic hernia without obstruction or gangrene: Secondary | ICD-10-CM | POA: Diagnosis not present

## 2019-10-25 DIAGNOSIS — Z1211 Encounter for screening for malignant neoplasm of colon: Secondary | ICD-10-CM | POA: Diagnosis not present

## 2019-10-25 LAB — HM COLONOSCOPY

## 2019-11-07 ENCOUNTER — Other Ambulatory Visit: Payer: Self-pay

## 2019-11-07 ENCOUNTER — Ambulatory Visit
Admission: RE | Admit: 2019-11-07 | Discharge: 2019-11-07 | Disposition: A | Discharge status: Home / Self Care | Payer: BC Managed Care – PPO | Source: Ambulatory Visit | Attending: Obstetrics and Gynecology | Admitting: Obstetrics and Gynecology

## 2019-11-07 DIAGNOSIS — Z1231 Encounter for screening mammogram for malignant neoplasm of breast: Secondary | ICD-10-CM

## 2019-12-08 DIAGNOSIS — N841 Polyp of cervix uteri: Secondary | ICD-10-CM | POA: Diagnosis not present

## 2019-12-08 DIAGNOSIS — Z6822 Body mass index (BMI) 22.0-22.9, adult: Secondary | ICD-10-CM | POA: Diagnosis not present

## 2019-12-08 DIAGNOSIS — Z01419 Encounter for gynecological examination (general) (routine) without abnormal findings: Secondary | ICD-10-CM | POA: Diagnosis not present

## 2019-12-13 DIAGNOSIS — N841 Polyp of cervix uteri: Secondary | ICD-10-CM | POA: Diagnosis not present

## 2019-12-13 DIAGNOSIS — N924 Excessive bleeding in the premenopausal period: Secondary | ICD-10-CM | POA: Diagnosis not present

## 2019-12-22 DIAGNOSIS — N841 Polyp of cervix uteri: Secondary | ICD-10-CM | POA: Diagnosis not present

## 2019-12-28 DIAGNOSIS — N840 Polyp of corpus uteri: Secondary | ICD-10-CM | POA: Diagnosis not present

## 2019-12-28 DIAGNOSIS — N841 Polyp of cervix uteri: Secondary | ICD-10-CM | POA: Diagnosis not present

## 2020-03-23 DIAGNOSIS — M13831 Other specified arthritis, right wrist: Secondary | ICD-10-CM | POA: Diagnosis not present

## 2020-03-23 DIAGNOSIS — M25531 Pain in right wrist: Secondary | ICD-10-CM | POA: Insufficient documentation

## 2020-05-08 DIAGNOSIS — Z85828 Personal history of other malignant neoplasm of skin: Secondary | ICD-10-CM | POA: Diagnosis not present

## 2020-05-08 DIAGNOSIS — L814 Other melanin hyperpigmentation: Secondary | ICD-10-CM | POA: Diagnosis not present

## 2020-05-08 DIAGNOSIS — L821 Other seborrheic keratosis: Secondary | ICD-10-CM | POA: Diagnosis not present

## 2020-05-08 DIAGNOSIS — D225 Melanocytic nevi of trunk: Secondary | ICD-10-CM | POA: Diagnosis not present

## 2020-05-15 ENCOUNTER — Other Ambulatory Visit: Payer: Self-pay | Admitting: Family Medicine

## 2020-05-15 DIAGNOSIS — Z1159 Encounter for screening for other viral diseases: Secondary | ICD-10-CM

## 2020-05-15 DIAGNOSIS — Z130 Encounter for screening for diseases of the blood and blood-forming organs and certain disorders involving the immune mechanism: Secondary | ICD-10-CM

## 2020-05-15 DIAGNOSIS — Z1389 Encounter for screening for other disorder: Secondary | ICD-10-CM

## 2020-05-15 DIAGNOSIS — E7800 Pure hypercholesterolemia, unspecified: Secondary | ICD-10-CM

## 2020-05-15 DIAGNOSIS — Z114 Encounter for screening for human immunodeficiency virus [HIV]: Secondary | ICD-10-CM

## 2020-05-16 ENCOUNTER — Other Ambulatory Visit: Payer: Self-pay

## 2020-05-16 ENCOUNTER — Other Ambulatory Visit (INDEPENDENT_AMBULATORY_CARE_PROVIDER_SITE_OTHER): Payer: BC Managed Care – PPO

## 2020-05-16 DIAGNOSIS — Z114 Encounter for screening for human immunodeficiency virus [HIV]: Secondary | ICD-10-CM | POA: Diagnosis not present

## 2020-05-16 DIAGNOSIS — Z130 Encounter for screening for diseases of the blood and blood-forming organs and certain disorders involving the immune mechanism: Secondary | ICD-10-CM

## 2020-05-16 DIAGNOSIS — E7800 Pure hypercholesterolemia, unspecified: Secondary | ICD-10-CM

## 2020-05-16 DIAGNOSIS — Z1389 Encounter for screening for other disorder: Secondary | ICD-10-CM

## 2020-05-16 DIAGNOSIS — Z1159 Encounter for screening for other viral diseases: Secondary | ICD-10-CM

## 2020-05-16 LAB — COMPREHENSIVE METABOLIC PANEL
ALT: 13 U/L (ref 0–35)
AST: 18 U/L (ref 0–37)
Albumin: 4.1 g/dL (ref 3.5–5.2)
Alkaline Phosphatase: 52 U/L (ref 39–117)
BUN: 13 mg/dL (ref 6–23)
CO2: 28 mEq/L (ref 19–32)
Calcium: 8.9 mg/dL (ref 8.4–10.5)
Chloride: 103 mEq/L (ref 96–112)
Creatinine, Ser: 0.87 mg/dL (ref 0.40–1.20)
GFR: 77.47 mL/min (ref >60.00)
Glucose, Bld: 93 mg/dL (ref 70–99)
Potassium: 4.2 mEq/L (ref 3.5–5.1)
Sodium: 137 mEq/L (ref 135–145)
Total Bilirubin: 0.7 mg/dL (ref 0.2–1.2)
Total Protein: 7.2 g/dL (ref 6.0–8.3)

## 2020-05-16 LAB — CBC WITH DIFFERENTIAL/PLATELET
Basophils Absolute: 0.1 10*3/uL (ref 0.0–0.1)
Basophils Relative: 2.5 % (ref 0.0–3.0)
Eosinophils Absolute: 0.2 10*3/uL (ref 0.0–0.7)
Eosinophils Relative: 4.2 % (ref 0.0–5.0)
HCT: 31.7 % — ABNORMAL LOW (ref 36.0–46.0)
Hemoglobin: 9.9 g/dL — ABNORMAL LOW (ref 12.0–15.0)
Lymphocytes Relative: 20.2 % (ref 12.0–46.0)
Lymphs Abs: 1 10*3/uL (ref 0.7–4.0)
MCHC: 31.2 g/dL (ref 30.0–36.0)
MCV: 73.3 fl — ABNORMAL LOW (ref 78.0–100.0)
Monocytes Absolute: 0.6 10*3/uL (ref 0.1–1.0)
Monocytes Relative: 11.6 % (ref 3.0–12.0)
Neutro Abs: 3.1 10*3/uL (ref 1.4–7.7)
Neutrophils Relative %: 61.5 % (ref 43.0–77.0)
Platelets: 395 10*3/uL (ref 150.0–400.0)
RBC: 4.32 Mil/uL (ref 3.87–5.11)
RDW: 17.2 % — ABNORMAL HIGH (ref 11.5–15.5)
WBC: 5 10*3/uL (ref 4.0–10.5)

## 2020-05-16 LAB — LIPID PANEL
Cholesterol: 182 mg/dL (ref 0–200)
HDL: 67.9 mg/dL (ref >39.00)
LDL Cholesterol: 102 mg/dL — ABNORMAL HIGH (ref 0–99)
NonHDL: 114.12
Total CHOL/HDL Ratio: 3
Triglycerides: 61 mg/dL (ref 0.0–149.0)
VLDL: 12.2 mg/dL (ref 0.0–40.0)

## 2020-05-17 LAB — HIV ANTIBODY (ROUTINE TESTING W REFLEX): HIV 1&2 Ab, 4th Generation: NONREACTIVE

## 2020-05-17 LAB — HEPATITIS C ANTIBODY
Hepatitis C Ab: NONREACTIVE
SIGNAL TO CUT-OFF: 0.01 (ref <1.00)

## 2020-05-21 ENCOUNTER — Other Ambulatory Visit: Payer: Self-pay

## 2020-05-21 ENCOUNTER — Ambulatory Visit (INDEPENDENT_AMBULATORY_CARE_PROVIDER_SITE_OTHER): Payer: BC Managed Care – PPO | Admitting: Family Medicine

## 2020-05-21 VITALS — BP 106/64 | HR 100 | Temp 98.5°F | Resp 16 | Ht 67.0 in | Wt 139.0 lb

## 2020-05-21 DIAGNOSIS — N920 Excessive and frequent menstruation with regular cycle: Secondary | ICD-10-CM | POA: Diagnosis not present

## 2020-05-21 DIAGNOSIS — Z0000 Encounter for general adult medical examination without abnormal findings: Secondary | ICD-10-CM

## 2020-05-21 DIAGNOSIS — D509 Iron deficiency anemia, unspecified: Secondary | ICD-10-CM | POA: Diagnosis not present

## 2020-05-21 DIAGNOSIS — K219 Gastro-esophageal reflux disease without esophagitis: Secondary | ICD-10-CM

## 2020-05-21 NOTE — Progress Notes (Signed)
Subjective:    Patient ID: Jo Cohen, female    DOB: [birth year], 49 y.o.   MRN: [id number]  HPI Chief Complaint  Patient presents with  . Annual Exam   This is a 49 yo female who presents today for annual exam.     Last CPE- 04/2019 Mammo- 11/07/2019 Pap-gyn, 12/08/2019 Colonoscopy- 10/25/2019 Tdap- unknown  Flu- has had Covid 19 vaccine- fully vaccinated Eye- annual, has upcoming appointment Dental-annual, has upcoming appointment Exercise- walking, yard work  New anemia- had colonoscopy 5/21. Has been seeing gyn, had a D&C earlier this year (01/07/20) for heavy periods. When she had labs, she was having her period. They have been better since procedure. Denies dark or bloody bowel movements. Hematuria.   GERD- on pantoprazole, doing well     Review of Systems  Constitutional: Negative.   HENT: Negative.   Eyes: Negative.   Respiratory: Negative.   Cardiovascular: Negative.   Gastrointestinal: Positive for abdominal pain (GERD well controlled on pantoprazole, sees GI).  Endocrine: Negative.   Genitourinary: Positive for menstrual problem and vaginal bleeding. Negative for difficulty urinating, frequency, hematuria and pelvic pain.  Musculoskeletal: Negative.   Allergic/Immunologic: Negative.   Neurological: Negative.   Hematological: Negative.   Psychiatric/Behavioral: Negative.        Objective:   Physical Exam Physical Exam  Constitutional: She is oriented to person, place, and time. She appears well-developed and well-nourished. No distress.  HENT:  Head: Normocephalic and atraumatic.  Right Ear: External ear normal. TM normal.  Left Ear: External ear normal. TM normal.  Nose: Nose normal.  Mouth/Throat: Oropharynx is clear and moist. No oropharyngeal exudate.  Eyes: Conjunctivae are normal.   Neck: Normal range of motion. Neck supple. No JVD present. No thyromegaly present.  Cardiovascular: Normal rate, regular rhythm, normal heart sounds and  intact distal pulses.   Pulmonary/Chest: Effort normal and breath sounds normal.  Abdominal: Soft. Bowel sounds are normal. She exhibits no distension and no mass. There is no tenderness. There is no rebound and no guarding.  Musculoskeletal: Normal range of motion. She exhibits no edema or tenderness.  Lymphadenopathy:    She has no cervical adenopathy.  Neurological: She is alert and oriented to person, place, and time.   Skin: Skin is warm and dry. She is not diaphoretic.  Psychiatric: She has a normal mood and affect. Her behavior is normal. Judgment and thought content normal.  Vitals reviewed.    BP 106/64   Pulse 100   Temp 98.5 F (36.9 C) (Temporal)   Resp 16   Ht 5\' 7"  (1.702 m)   Wt 139 lb (63 kg)   SpO2 99%   BMI 21.77 kg/m  Wt Readings from Last 3 Encounters:  05/21/20 139 lb (63 kg)  01/31/19 142 lb (64.4 kg)  12/08/18 141 lb 12 oz (64.3 kg)        Assessment & Plan:  1. Annual physical exam - Discussed and encouraged healthy lifestyle choices- adequate sleep, regular exercise, stress management and healthy food choices.    2. Menorrhagia with regular cycle - reviewed records from gyn, sent to scan - CBC with Differential/Platelet; Future - Ferritin; Future  3. Iron deficiency anemia, unspecified iron deficiency anemia type - had recent D&C, colonoscopy, history of heavy menstrual bleeding, will have her start otc ferrous sulfate every other day and recheck labs in 3 months, sooner if any symptoms - CBC with Differential/Platelet; Future - Ferritin; Future  4. Gastroesophageal reflux disease, unspecified whether  esophagitis present -Well-controlled on pantoprazole -Continue follow-up with GI  This visit occurred during the SARS-CoV-2 public health emergency.  Safety protocols were in place, including screening questions prior to the visit, additional usage of staff PPE, and extensive cleaning of exam room while observing appropriate contact time as  indicated for disinfecting solutions.      Olean Ree, FNP-BC  [facility] at [facility], [facility]  05/26/2020 10:14 AM

## 2020-05-21 NOTE — Patient Instructions (Signed)
Good to see you today  Please follow up for blood work in 3 months to check on your anemia. If you develop any shortness of breath, fatigue, fast heart racing, blood in bowel movements please follow up sooner.   Please start over the counter ferrous sulfate 325 mg every other day/ three days a week. Can cause constipation, can take stool softener if needed

## 2020-05-26 ENCOUNTER — Encounter: Payer: Self-pay | Admitting: Family Medicine

## 2020-05-26 DIAGNOSIS — D509 Iron deficiency anemia, unspecified: Secondary | ICD-10-CM | POA: Insufficient documentation

## 2020-06-28 ENCOUNTER — Telehealth: Payer: Self-pay | Admitting: Family Medicine

## 2020-06-28 NOTE — Telephone Encounter (Signed)
Will you please contact patient to schedule TOC due to Debbie leaving? 

## 2020-07-05 NOTE — Telephone Encounter (Signed)
Spoke with patient about TOC she couldn't talk to me about appts at the time. States that she will call back to schedule. EM

## 2020-07-12 ENCOUNTER — Telehealth: Payer: Self-pay | Admitting: Family Medicine

## 2020-07-12 NOTE — Telephone Encounter (Signed)
Called patient about TOC appt. Patient is giving me pushback on this. Patient doesn't feel like she should have to come in for a visit and be charged to do a TOC. Patient states that she "doesn't have time" to come in. She requested to do a virtual visit with a new provider. She requested a female provider and not [employer]. Patient is requesting a call back to see if we can do a virtual with her new provider. I explained to her that most of the providers would like to have her in person for the first visit but I would ask and see what we could do for her. She states she only comes for  Labs and occassional sick visits. Please advise patient EM

## 2020-07-13 NOTE — Telephone Encounter (Signed)
Would either of you be comfortable seeing this patient virtually for her TOC?  If not, I understand and will call back and discuss with her appropriate next steps to schedule in person with you.   Thanks.

## 2020-07-14 NOTE — Telephone Encounter (Signed)
Would need to be in-person on my end. If she rarely has needs she could just schedule her annual in December. But would need in-person visit if anything came up prior to that.

## 2020-07-16 NOTE — Telephone Encounter (Signed)
Left general message on patients voicemail that when she is ready for an appointment to establish with one of our providers to please call back at her convenience and I apologized for any inconvenience.  Also, if she has any questions or concerns to please call back and ask to speak to me.

## 2020-08-07 ENCOUNTER — Telehealth: Payer: Self-pay

## 2020-08-07 DIAGNOSIS — D509 Iron deficiency anemia, unspecified: Secondary | ICD-10-CM

## 2020-08-07 NOTE — Telephone Encounter (Signed)
Per lab, pt has apt on 08/21/20 for CBC w/diff and ferritin which was ordered by Caswell Corwin at last OV which was CPE on 05/21/20. Pt has hx of anemia and it is charted that pt is taking ferrous sulfate every other day, but it is not on the med list. Last lab results; last ferritin result is from 05/10/2018 and was normal at 21.7  Recent Results (from the past 2160 hour(s))  Comprehensive metabolic panel     Status: None   Collection Time: 05/16/20  8:02 AM  Result Value Ref Range   Sodium 137 135 - 145 mEq/L   Potassium 4.2 3.5 - 5.1 mEq/L   Chloride 103 96 - 112 mEq/L   CO2 28 19 - 32 mEq/L   Glucose, Bld 93 70 - 99 mg/dL   BUN 13 6 - 23 mg/dL   Creatinine, Ser 3.81 0.40 - 1.20 mg/dL   Total Bilirubin 0.7 0.2 - 1.2 mg/dL   Alkaline Phosphatase 52 39 - 117 U/L   AST 18 0 - 37 U/L   ALT 13 0 - 35 U/L   Total Protein 7.2 6.0 - 8.3 g/dL   Albumin 4.1 3.5 - 5.2 g/dL   GFR 82.99 >37.16 mL/min    Comment: Calculated using the CKD-EPI Creatinine Equation (2021)   Calcium 8.9 8.4 - 10.5 mg/dL  CBC with Differential/Platelet     Status: Abnormal   Collection Time: 05/16/20  8:02 AM  Result Value Ref Range   WBC 5.0 4.0 - 10.5 K/uL   RBC 4.32 3.87 - 5.11 Mil/uL   Hemoglobin 9.9 (L) 12.0 - 15.0 g/dL   HCT 96.7 (L) 89.3 - 81.0 %   MCV 73.3 (L) 78.0 - 100.0 fl   MCHC 31.2 30.0 - 36.0 g/dL   RDW 17.5 (H) 10.2 - 58.5 %   Platelets 395.0 150.0 - 400.0 K/uL   Neutrophils Relative % 61.5 43.0 - 77.0 %   Lymphocytes Relative 20.2 12.0 - 46.0 %   Monocytes Relative 11.6 3.0 - 12.0 %   Eosinophils Relative 4.2 0.0 - 5.0 %   Basophils Relative 2.5 0.0 - 3.0 %   Neutro Abs 3.1 1.4 - 7.7 K/uL   Lymphs Abs 1.0 0.7 - 4.0 K/uL   Monocytes Absolute 0.6 0.1 - 1.0 K/uL   Eosinophils Absolute 0.2 0.0 - 0.7 K/uL   Basophils Absolute 0.1 0.0 - 0.1 K/uL  Lipid panel     Status: Abnormal   Collection Time: 05/16/20  8:02 AM  Result Value Ref Range   Cholesterol 182 0 - 200 mg/dL    Comment: ATP III  Classification       Desirable:  < 200 mg/dL               Borderline High:  200 - 239 mg/dL          High:  > = 277 mg/dL   Triglycerides 82.4 0.0 - 149.0 mg/dL    Comment: Normal:  <235 mg/dLBorderline High:  150 - 199 mg/dL   HDL 36.14 >43.15 mg/dL   VLDL 40.0 0.0 - 86.7 mg/dL   LDL Cholesterol 619 (H) 0 - 99 mg/dL   Total CHOL/HDL Ratio 3     Comment:                Men          Women1/2 Average Risk     3.4          3.3Average Risk  5.0          4.42X Average Risk          9.6          7.13X Average Risk          15.0          11.0                       NonHDL 114.12     Comment: NOTE:  Non-HDL goal should be 30 mg/dL higher than patient's LDL goal (i.e. LDL goal of < 70 mg/dL, would have non-HDL goal of < 100 mg/dL)  HIV Antibody (routine testing w rflx)     Status: None   Collection Time: 05/16/20  8:02 AM  Result Value Ref Range   HIV 1&2 Ab, 4th Generation NON-REACTIVE NON-REACTI    Comment: HIV-1 antigen and HIV-1/HIV-2 antibodies were not detected. There is no laboratory evidence of HIV infection. Marland Kitchen PLEASE NOTE: This information has been disclosed to you from records whose confidentiality may be protected by state law.  If your state requires such protection, then the state law prohibits you from making any further disclosure of the information without the specific written consent of the person to whom it pertains, or as otherwise permitted by law. A general authorization for the release of medical or other information is NOT sufficient for this purpose. . For additional information please refer to http://education.questdiagnostics.com/faq/FAQ106 (This link is being provided for informational/ educational purposes only.) . Marland Kitchen The performance of this assay has not been clinically validated in patients less than 58 years old. .   Hepatitis C antibody     Status: None   Collection Time: 05/16/20  8:02 AM  Result Value Ref Range   Hepatitis C Ab NON-REACTIVE  NON-REACTI   SIGNAL TO CUT-OFF 0.01 <1.00    Comment: . HCV antibody was non-reactive. There is no laboratory  evidence of HCV infection. . In most cases, no further action is required. However, if recent HCV exposure is suspected, a test for HCV RNA (test code 72536) is suggested. . For additional information please refer to http://education.questdiagn[facility].com/faq/FAQ22v1 (This link is being provided for informational/ educational purposes only.) .    Pt has not established care with a new provider yet and would like to wait currently since she just had her CPE in Dec. Will send msg to lead MD to see if ok to proceed with orders from [facility].

## 2020-08-09 NOTE — Telephone Encounter (Signed)
I put in orders for CBC and ferritin.  Please use my orders for the recheck.  Routed to lab as FYI.  If you can remove Gessner's orders, please do so.   Carollee Herter- I need a copy of her colonoscopy.  I don't see it in the EMR.  Per EMR it was done 2021 but I need the report.  Thanks.

## 2020-08-09 NOTE — Telephone Encounter (Signed)
Labs ordered by Eunice Blase have been cancelled.  Contacted pt who reports colonoscopy was with [facility], Dr. Ewing Schlein, at [phone] on 10/25/19. Pt also said she has been taking iron 3 times weekly as advised but the bottle she bought the last time is confusing and she is not sure if the capsules are 65mg  and she is supposed to take 5 capsules or each capsule is 325mg . Advised pt to bring bottle to pharmacy so they can advise. She said she did and they said she would have to contact office. Advised her to bring bottle here. She said she would when she came in for labs on 3/8. She said currently she is taking one capsule which is either 65mg  or 325mg  3 times weekly. Advised she should bring the bottle in but she will not until the lab apt. Advised not to take anymore than she has been taking. Pt verbalized understanding.    Left VM with medical records requesting they fax colonoscopy report and any pathology results if there are any.

## 2020-08-13 NOTE — Telephone Encounter (Signed)
Colonoscopy report received and placed in scan folder and copy made and placed in Dr. Lianne Bushy inbox for review.

## 2020-08-21 ENCOUNTER — Other Ambulatory Visit (INDEPENDENT_AMBULATORY_CARE_PROVIDER_SITE_OTHER): Payer: BC Managed Care – PPO

## 2020-08-21 ENCOUNTER — Telehealth: Payer: Self-pay | Admitting: Radiology

## 2020-08-21 ENCOUNTER — Other Ambulatory Visit: Payer: Self-pay

## 2020-08-21 DIAGNOSIS — D509 Iron deficiency anemia, unspecified: Secondary | ICD-10-CM

## 2020-08-21 LAB — CBC WITH DIFFERENTIAL/PLATELET
Basophils Absolute: 0.1 10*3/uL (ref 0.0–0.1)
Basophils Relative: 2 % (ref 0.0–3.0)
Eosinophils Absolute: 0.2 10*3/uL (ref 0.0–0.7)
Eosinophils Relative: 4.3 % (ref 0.0–5.0)
HCT: 40.3 % (ref 36.0–46.0)
Hemoglobin: 13.3 g/dL (ref 12.0–15.0)
Lymphocytes Relative: 22.3 % (ref 12.0–46.0)
Lymphs Abs: 1 10*3/uL (ref 0.7–4.0)
MCHC: 33.1 g/dL (ref 30.0–36.0)
MCV: 88.1 fl (ref 78.0–100.0)
Monocytes Absolute: 0.6 10*3/uL (ref 0.1–1.0)
Monocytes Relative: 13 % — ABNORMAL HIGH (ref 3.0–12.0)
Neutro Abs: 2.5 10*3/uL (ref 1.4–7.7)
Neutrophils Relative %: 58.4 % (ref 43.0–77.0)
Platelets: 244 10*3/uL (ref 150.0–400.0)
RBC: 4.57 Mil/uL (ref 3.87–5.11)
RDW: 18.3 % — ABNORMAL HIGH (ref 11.5–15.5)
WBC: 4.3 10*3/uL (ref 4.0–10.5)

## 2020-08-21 LAB — FERRITIN: Ferritin: 9.2 ng/mL — ABNORMAL LOW (ref 10.0–291.0)

## 2020-08-21 NOTE — Telephone Encounter (Signed)
Patient is confused about how much iron to take, the bottle says, 65mg  and 325 ferrous sulfate. told her to take 325 of iron daily. Her question is, how many does she take daily.

## 2020-08-22 NOTE — Telephone Encounter (Signed)
Per Caswell Corwin, NP, note on 05/21/20 3. Iron deficiency anemia, unspecified iron deficiency anemia type - had recent D&C, colonoscopy, history of heavy menstrual bleeding, will have her start otc ferrous sulfate every other day and recheck labs in 3 months, sooner if any symptoms - CBC with Differential/Platelet; Future - Ferritin; Future  Advised pt it would be best to wait until the labs she had yesterday were interpreted by another provider to let her know how much iron she should be taking if necessary. Advised that the 65 mg iron she is taking is equivalent to 325 mg ferrous sulfate so if Debbie told her to take 325 mg ferrous sulfate every other day she would take one every other day. She reports Debbie told her to take one tablet Mon, Wed and Fri and she thinks it was 325 mg but not sure.  She will wait for call from office concerning labs from yesterday.pt verbalized understanding.

## 2020-08-26 ENCOUNTER — Other Ambulatory Visit: Payer: Self-pay | Admitting: Family Medicine

## 2020-08-26 MED ORDER — FERROUS SULFATE 325 (65 FE) MG PO TABS: 325.0000 mg | ORAL_TABLET | ORAL | Status: AC

## 2020-08-26 NOTE — Addendum Note (Signed)
Addended by: Joaquim Nam on: 08/26/2020 07:24 PM   Modules accepted: Orders

## 2020-08-26 NOTE — Telephone Encounter (Signed)
See result note.  Thanks!

## 2020-10-17 IMAGING — DX DG CHEST 2V
2 series · 2 of 2 positions shown · non-contrast
Comparison: None.

CLINICAL DATA: Shortness of breath for several weeks

EXAM:
CHEST - 2 VIEW

[chest pa]
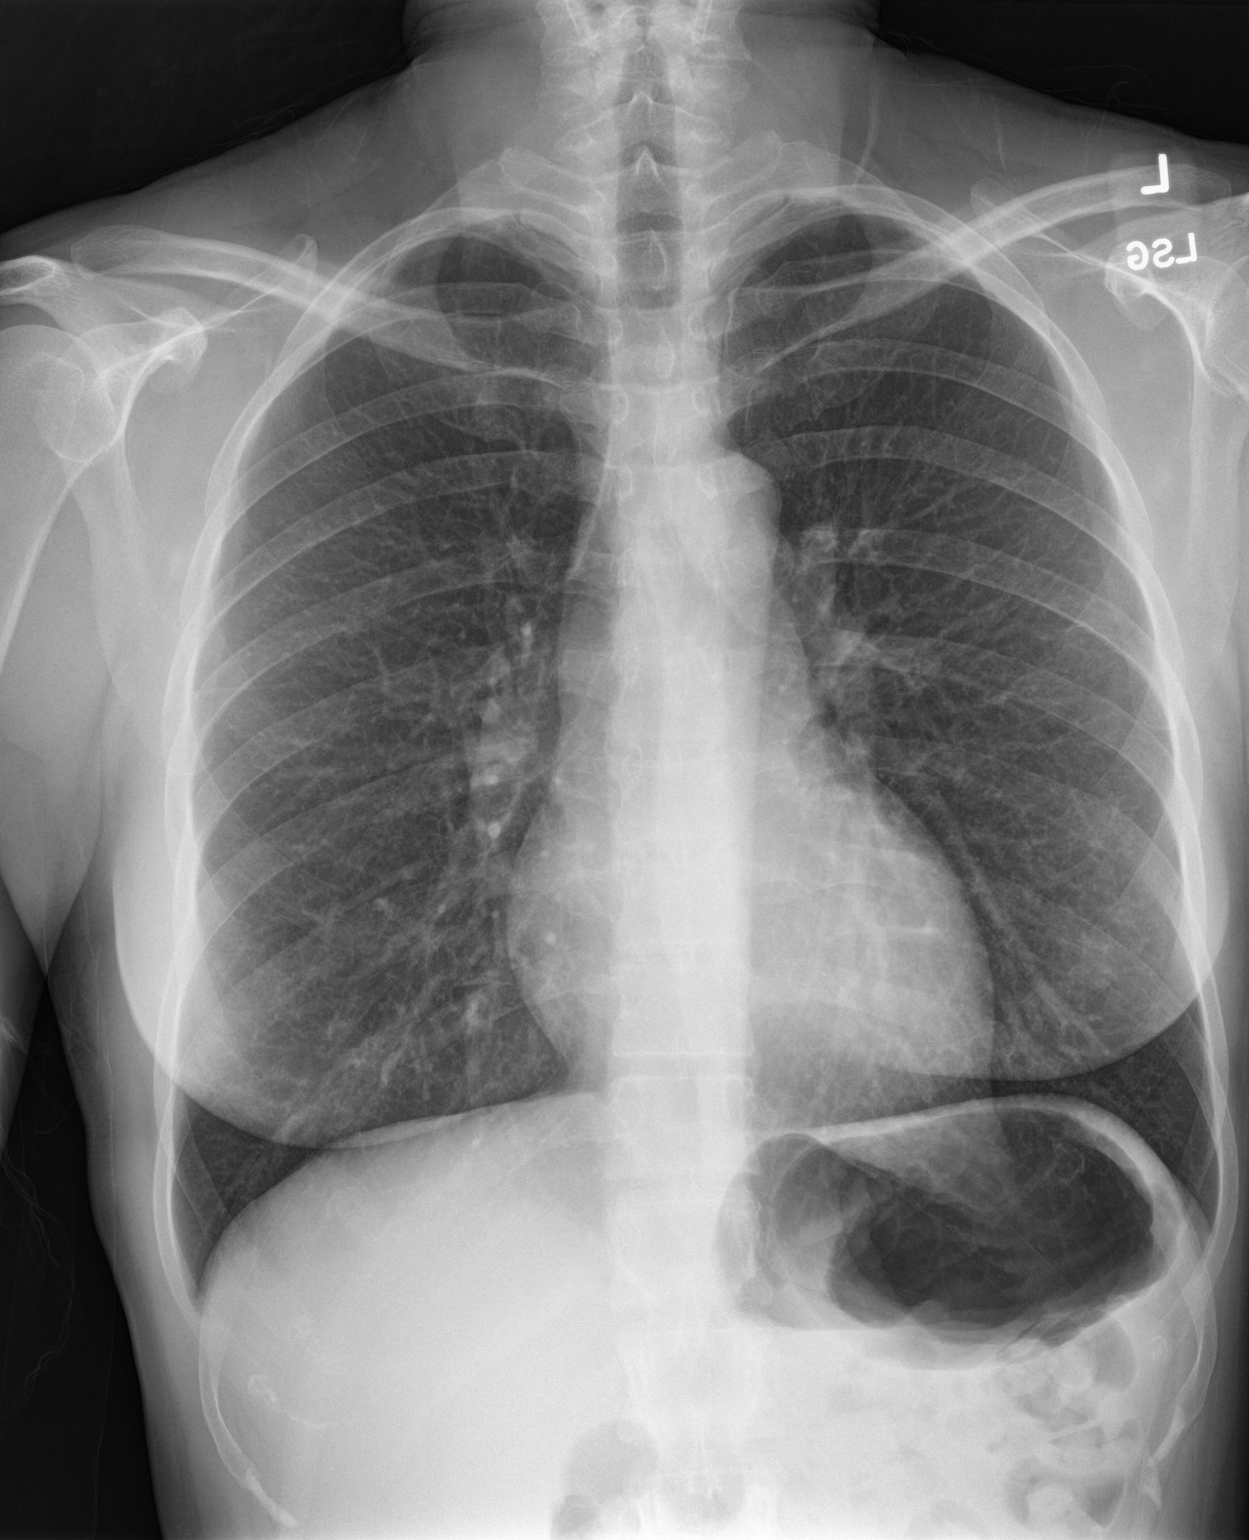

[chest lat]
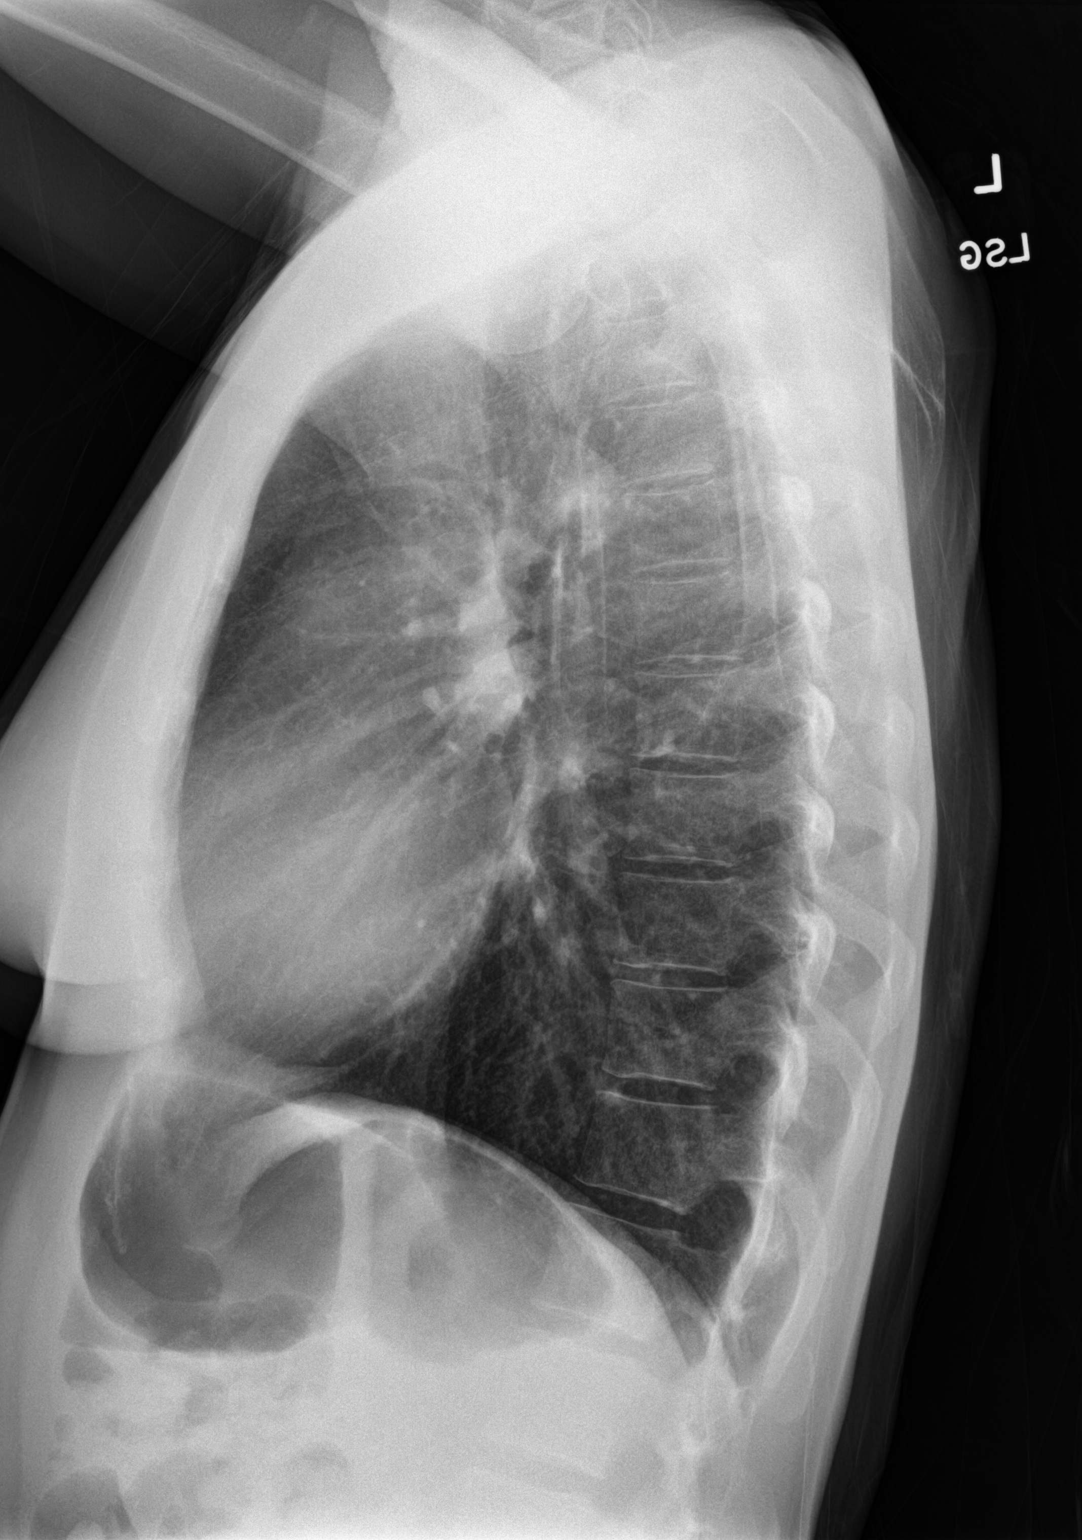

[2 of 2 positions shown; findings below may reference images not displayed]

FINDINGS: The heart size and mediastinal contours are within normal limits.
Both lungs are clear. The visualized skeletal structures are
unremarkable.
IMPRESSION: No active cardiopulmonary disease.

## 2020-11-14 ENCOUNTER — Ambulatory Visit
Admission: RE | Admit: 2020-11-14 | Discharge: 2020-11-14 | Disposition: A | Discharge status: Home / Self Care | Payer: BC Managed Care – PPO | Source: Ambulatory Visit | Attending: Obstetrics and Gynecology | Admitting: Obstetrics and Gynecology

## 2020-11-14 ENCOUNTER — Other Ambulatory Visit: Payer: Self-pay | Admitting: Obstetrics and Gynecology

## 2020-11-14 ENCOUNTER — Other Ambulatory Visit: Payer: Self-pay

## 2020-11-14 DIAGNOSIS — Z1231 Encounter for screening mammogram for malignant neoplasm of breast: Secondary | ICD-10-CM | POA: Diagnosis not present

## 2020-11-19 ENCOUNTER — Other Ambulatory Visit: Payer: Self-pay | Admitting: Obstetrics and Gynecology

## 2020-11-19 DIAGNOSIS — R928 Other abnormal and inconclusive findings on diagnostic imaging of breast: Secondary | ICD-10-CM

## 2020-11-27 ENCOUNTER — Other Ambulatory Visit: Payer: Self-pay

## 2020-11-27 ENCOUNTER — Encounter: Payer: Self-pay | Admitting: Family Medicine

## 2020-11-27 ENCOUNTER — Ambulatory Visit (INDEPENDENT_AMBULATORY_CARE_PROVIDER_SITE_OTHER): Payer: BC Managed Care – PPO | Admitting: Family Medicine

## 2020-11-27 VITALS — BP 98/78 | HR 72 | Temp 97.7°F | Ht 67.0 in | Wt 145.8 lb

## 2020-11-27 DIAGNOSIS — D509 Iron deficiency anemia, unspecified: Secondary | ICD-10-CM | POA: Diagnosis not present

## 2020-11-27 DIAGNOSIS — K219 Gastro-esophageal reflux disease without esophagitis: Secondary | ICD-10-CM | POA: Diagnosis not present

## 2020-11-27 LAB — CBC
HCT: 39.3 % (ref 36.0–46.0)
Hemoglobin: 13.1 g/dL (ref 12.0–15.0)
MCHC: 33.2 g/dL (ref 30.0–36.0)
MCV: 94.7 fl (ref 78.0–100.0)
Platelets: 228 10*3/uL (ref 150.0–400.0)
RBC: 4.15 Mil/uL (ref 3.87–5.11)
RDW: 15 % (ref 11.5–15.5)
WBC: 4.3 10*3/uL (ref 4.0–10.5)

## 2020-11-27 LAB — FERRITIN: Ferritin: 12.3 ng/mL (ref 10.0–291.0)

## 2020-11-27 NOTE — Assessment & Plan Note (Signed)
Pt reports normal EGD. Advised trying to reduce protonix dose with goal of prn. Discussed increased risk of osteoporosis with long-term use

## 2020-11-27 NOTE — Patient Instructions (Signed)
Consider reducing the Protonix could do every other day and then just switch to as needed  I can also send in a lower dose once you finish your current supply

## 2020-11-27 NOTE — Assessment & Plan Note (Signed)
Will recheck labs. No longer with heavy bleeding and reassuring GI work-up. No fatigue. Anticipate if CBC normal and iron still low could consider stopping as no sign of active blood loss.

## 2020-11-27 NOTE — Progress Notes (Signed)
   Subjective:     Jo Cohen is a 50 y.o. female presenting for Transitions Of Care (With lab work to check iron level)     HPI  #Iron deficiency anemia - still with cramps - s/p D&C - follows with GYN - energy level overall good - taking 1 iron pill 3 days a week   #GERD - had EGD - well controlled  Review of Systems   Social History   Tobacco Use  Smoking Status Never  Smokeless Tobacco Never        Objective:    BP Readings from Last 3 Encounters:  11/27/20 98/78  05/21/20 106/64  12/08/18 104/62   Wt Readings from Last 3 Encounters:  11/27/20 145 lb 12 oz (66.1 kg)  05/21/20 139 lb (63 kg)  01/31/19 142 lb (64.4 kg)    BP 98/78   Pulse 72   Temp 97.7 F (36.5 C) (Temporal)   Ht 5\' 7"  (1.702 m)   Wt 145 lb 12 oz (66.1 kg)   LMP 11/24/2020 (Exact Date)   SpO2 99%   BMI 22.83 kg/m    Physical Exam Constitutional:      General: She is not in acute distress.    Appearance: She is well-developed. She is not diaphoretic.  HENT:     Right Ear: External ear normal.     Left Ear: External ear normal.  Eyes:     Conjunctiva/sclera: Conjunctivae normal.     Comments: No conjunctival pallor  Cardiovascular:     Rate and Rhythm: Normal rate and regular rhythm.     Heart sounds: No murmur heard. Pulmonary:     Effort: Pulmonary effort is normal. No respiratory distress.     Breath sounds: Normal breath sounds. No wheezing.  Musculoskeletal:     Cervical back: Neck supple.  Skin:    General: Skin is warm and dry.     Capillary Refill: Capillary refill takes less than 2 seconds.  Neurological:     Mental Status: She is alert. Mental status is at baseline.  Psychiatric:        Mood and Affect: Mood normal.        Behavior: Behavior normal.          Assessment & Plan:   Problem List Items Addressed This Visit       Digestive   GERD (gastroesophageal reflux disease)    Pt reports normal EGD. Advised trying to reduce protonix  dose with goal of prn. Discussed increased risk of osteoporosis with long-term use         Other   Iron deficiency anemia - Primary    Will recheck labs. No longer with heavy bleeding and reassuring GI work-up. No fatigue. Anticipate if CBC normal and iron still low could consider stopping as no sign of active blood loss.        Relevant Orders   Ferritin   CBC     Return in about 1 year (around 11/27/2021).  11/29/2021, MD  This visit occurred during the SARS-CoV-2 public health emergency.  Safety protocols were in place, including screening questions prior to the visit, additional usage of staff PPE, and extensive cleaning of exam room while observing appropriate contact time as indicated for disinfecting solutions.

## 2020-12-11 DIAGNOSIS — Z01419 Encounter for gynecological examination (general) (routine) without abnormal findings: Secondary | ICD-10-CM | POA: Diagnosis not present

## 2020-12-11 DIAGNOSIS — Z6822 Body mass index (BMI) 22.0-22.9, adult: Secondary | ICD-10-CM | POA: Diagnosis not present

## 2020-12-12 ENCOUNTER — Other Ambulatory Visit: Payer: Self-pay

## 2020-12-12 ENCOUNTER — Ambulatory Visit: Payer: BC Managed Care – PPO

## 2020-12-12 ENCOUNTER — Ambulatory Visit
Admission: RE | Admit: 2020-12-12 | Discharge: 2020-12-12 | Disposition: A | Discharge status: Home / Self Care | Payer: BC Managed Care – PPO | Source: Ambulatory Visit | Attending: Obstetrics and Gynecology | Admitting: Obstetrics and Gynecology

## 2020-12-12 DIAGNOSIS — R922 Inconclusive mammogram: Secondary | ICD-10-CM | POA: Diagnosis not present

## 2020-12-12 DIAGNOSIS — R921 Mammographic calcification found on diagnostic imaging of breast: Secondary | ICD-10-CM | POA: Diagnosis not present

## 2020-12-12 DIAGNOSIS — R928 Other abnormal and inconclusive findings on diagnostic imaging of breast: Secondary | ICD-10-CM

## 2021-03-08 ENCOUNTER — Telehealth: Payer: Self-pay | Admitting: Family Medicine

## 2021-03-08 NOTE — Telephone Encounter (Signed)
Pt called in stated she needs to change her medication pantoprazole (PROTONIX) 40 MG tablet . Would like a call back # [phone]

## 2021-03-11 MED ORDER — PANTOPRAZOLE SODIUM 40 MG PO TBEC: 40.0000 mg | DELAYED_RELEASE_TABLET | Freq: Every day | ORAL | 1 refills | Status: DC

## 2021-03-11 NOTE — Telephone Encounter (Signed)
Pt called checking  on the status of her medication. Pt states she only have 3 pills left

## 2021-05-08 DIAGNOSIS — Z85828 Personal history of other malignant neoplasm of skin: Secondary | ICD-10-CM | POA: Diagnosis not present

## 2021-05-08 DIAGNOSIS — D225 Melanocytic nevi of trunk: Secondary | ICD-10-CM | POA: Diagnosis not present

## 2021-05-08 DIAGNOSIS — L821 Other seborrheic keratosis: Secondary | ICD-10-CM | POA: Diagnosis not present

## 2021-11-04 ENCOUNTER — Other Ambulatory Visit: Payer: Self-pay | Admitting: Obstetrics and Gynecology

## 2021-11-04 DIAGNOSIS — Z1231 Encounter for screening mammogram for malignant neoplasm of breast: Secondary | ICD-10-CM

## 2021-11-19 ENCOUNTER — Ambulatory Visit
Admission: RE | Admit: 2021-11-19 | Discharge: 2021-11-19 | Disposition: A | Discharge status: Home / Self Care | Payer: BC Managed Care – PPO | Source: Ambulatory Visit | Attending: Obstetrics and Gynecology | Admitting: Obstetrics and Gynecology

## 2021-11-19 DIAGNOSIS — Z1231 Encounter for screening mammogram for malignant neoplasm of breast: Secondary | ICD-10-CM

## 2021-11-22 ENCOUNTER — Other Ambulatory Visit: Payer: BC Managed Care – PPO

## 2021-11-29 ENCOUNTER — Ambulatory Visit (INDEPENDENT_AMBULATORY_CARE_PROVIDER_SITE_OTHER): Payer: BC Managed Care – PPO | Admitting: Family Medicine

## 2021-11-29 VITALS — BP 92/60 | HR 58 | Temp 97.6°F | Ht 66.5 in | Wt 145.1 lb

## 2021-11-29 DIAGNOSIS — E782 Mixed hyperlipidemia: Secondary | ICD-10-CM | POA: Diagnosis not present

## 2021-11-29 DIAGNOSIS — Z0000 Encounter for general adult medical examination without abnormal findings: Secondary | ICD-10-CM

## 2021-11-29 DIAGNOSIS — R232 Flushing: Secondary | ICD-10-CM | POA: Diagnosis not present

## 2021-11-29 DIAGNOSIS — D509 Iron deficiency anemia, unspecified: Secondary | ICD-10-CM

## 2021-11-29 LAB — COMPREHENSIVE METABOLIC PANEL
ALT: 19 U/L (ref 0–35)
AST: 20 U/L (ref 0–37)
Albumin: 4.3 g/dL (ref 3.5–5.2)
Alkaline Phosphatase: 51 U/L (ref 39–117)
BUN: 18 mg/dL (ref 6–23)
CO2: 28 mEq/L (ref 19–32)
Calcium: 9.4 mg/dL (ref 8.4–10.5)
Chloride: 103 mEq/L (ref 96–112)
Creatinine, Ser: 0.84 mg/dL (ref 0.40–1.20)
GFR: 79.93 mL/min (ref >60.00)
Glucose, Bld: 88 mg/dL (ref 70–99)
Potassium: 4.5 mEq/L (ref 3.5–5.1)
Sodium: 137 mEq/L (ref 135–145)
Total Bilirubin: 1.3 mg/dL — ABNORMAL HIGH (ref 0.2–1.2)
Total Protein: 6.7 g/dL (ref 6.0–8.3)

## 2021-11-29 LAB — LIPID PANEL
Cholesterol: 194 mg/dL (ref 0–200)
HDL: 73.8 mg/dL (ref >39.00)
LDL Cholesterol: 110 mg/dL — ABNORMAL HIGH (ref 0–99)
NonHDL: 120.26
Total CHOL/HDL Ratio: 3
Triglycerides: 49 mg/dL (ref 0.0–149.0)
VLDL: 9.8 mg/dL (ref 0.0–40.0)

## 2021-11-29 LAB — CBC
HCT: 43.5 % (ref 36.0–46.0)
Hemoglobin: 14.4 g/dL (ref 12.0–15.0)
MCHC: 33.2 g/dL (ref 30.0–36.0)
MCV: 96.7 fl (ref 78.0–100.0)
Platelets: 223 10*3/uL (ref 150.0–400.0)
RBC: 4.5 Mil/uL (ref 3.87–5.11)
RDW: 13.3 % (ref 11.5–15.5)
WBC: 4.8 10*3/uL (ref 4.0–10.5)

## 2021-11-29 LAB — FERRITIN: Ferritin: 18.7 ng/mL (ref 10.0–291.0)

## 2021-11-29 LAB — TSH: TSH: 2.06 u[IU]/mL (ref 0.35–5.50)

## 2021-11-29 MED ORDER — PANTOPRAZOLE SODIUM 40 MG PO TBEC: 40.0000 mg | DELAYED_RELEASE_TABLET | Freq: Every day | ORAL | 1 refills | Status: DC

## 2021-11-29 NOTE — Patient Instructions (Signed)
Continue healthy diet  Talk with your ob/gyn about menopause

## 2021-11-29 NOTE — Progress Notes (Signed)
Annual Exam   Chief Complaint:  Chief Complaint  Patient presents with   Annual Exam    Would like to talk about menopause     History of Present Illness:  Ms. Jo Cohen is a 51 y.o. No obstetric history on file. who LMP was Patient's last menstrual period was 11/04/2021 (exact date)., presents today for her annual examination.      Nutrition She does get adequate calcium and Vitamin D in her diet. Diet: healthy Exercise: boot camp twice a week, walking daily    Social History   Tobacco Use  Smoking Status Never  Smokeless Tobacco Never   Social History   Substance and Sexual Activity  Alcohol Use Yes   Alcohol/week: 0.0 standard drinks of alcohol   Comment: 1 or 2 per month   Social History   Substance and Sexual Activity  Drug Use No     General Health Dentist in the last year: Yes Eye doctor: yes  Safety The patient wears seatbelts: yes.     The patient feels safe at home and in their relationships: yes.   Menstrual:  Symptoms of menopause: hot flashes  GYN She is single partner, contraception - vasectomy.    Cervical Cancer Screening (21-65):   Last Pap:  2021 Results were: no abnormalities /neg HPV DNA   Breast Cancer Screening (Age 65-74):  There is no FH of breast cancer. There is no FH of ovarian cancer. BRCA screening Not Indicated.  Last Mammogram: 11/2021 The patient does want a mammogram this year.    Colon Cancer Screening:  Age 5-75 yo - benefits outweigh the risk. Adults 19-85 yo who have never been screened benefit.  Benefits: 134000 people in 2016 will be diagnosed and 49,000 will die - early detection helps [place]: Complications 2/2 to colonoscopy High Risk (Colonoscopy): genetic disorder (Lynch syndrome or familial adenomatous polyposis), personal hx of IBD, previous adenomatous polyp, or previous colorectal cancer, FamHx start 10 years before the age at diagnosis, increased in males and black race  Options:  FIT -  looks for hemoglobin (blood in the stool) - specific and fairly sensitive - must be done annually Cologuard - looks for DNA and blood - more sensitive - therefore can have more false positives, every 3 years Colonoscopy - every 10 years if normal - sedation, bowl prep, must have someone drive you  Shared decision making and the patient had decided to do colonoscopy 2031.   Social History   Tobacco Use  Smoking Status Never  Smokeless Tobacco Never    Lung Cancer Screening (Ages 19-50): not applicable   Weight Wt Readings from Last 3 Encounters:  11/29/21 145 lb 2 oz (65.8 kg)  11/27/20 145 lb 12 oz (66.1 kg)  05/21/20 139 lb (63 kg)   Patient has normal BMI  BMI Readings from Last 1 Encounters:  11/29/21 23.07 kg/m     Chronic disease screening Blood pressure monitoring:  BP Readings from Last 3 Encounters:  11/29/21 92/60  11/27/20 98/78  05/21/20 106/64    Lipid Monitoring: Indication for screening: age >62, obesity, diabetes, family hx, CV risk factors.  Lipid screening: Yes  Lab Results  Component Value Date   CHOL 182 05/16/2020   HDL 67.90 05/16/2020   LDLCALC 102 (H) 05/16/2020   LDLDIRECT 113.7 11/03/2012   TRIG 61.0 05/16/2020   CHOLHDL 3 05/16/2020     Diabetes Screening: age >78, overweight, family hx, PCOS, hx of gestational diabetes, at risk ethnicity Diabetes  Screening screening: Not Indicated  No results found for: "HGBA1C"   Past Medical History:  Diagnosis Date   Hemorrhoids     Past Surgical History:  Procedure Laterality Date   APPENDECTOMY  2007   [place]    Prior to Admission medications   Medication Sig Start Date End Date Taking? Authorizing Provider  ferrous sulfate 325 (65 FE) MG tablet Take 1 tablet (325 mg total) by mouth every Monday, Wednesday, and Friday. 08/27/20  Yes Tonia Ghent, MD  pantoprazole (PROTONIX) 40 MG tablet Take 1 tablet (40 mg total) by mouth daily. 03/11/21  Yes Lesleigh Noe, MD     No Known Allergies  Gynecologic History: Patient's last menstrual period was 11/04/2021 (exact date).  Obstetric History: No obstetric history on file.  Social History   Socioeconomic History   Marital status: Married    Spouse name: Gerald Stabs   Number of children: 3   Years of education: college   Highest education level: Not on file  Occupational History   Not on file  Tobacco Use   Smoking status: Never   Smokeless tobacco: Never  Vaping Use   Vaping Use: Never used  Substance and Sexual Activity   Alcohol use: Yes    Alcohol/week: 0.0 standard drinks of alcohol    Comment: 1 or 2 per month   Drug use: No   Sexual activity: Yes    Birth control/protection: Surgical    Comment: vasectomy  Other Topics Concern   Not on file  Social History Narrative   11/27/20   From: the area   Living: with husband, Gerald Stabs (2015)   Work: desk job - Biomedical scientist       Family: 3 children (triplets) - Ria Comment, Lysbeth Galas, Vonna Kotyk (1998)       Enjoys: outdoor activities - hiking, camping, bike riding, walking the dog      Exercise: gym - 3 days a week, and walking daily 30-40 minutes   Diet: generally good, tries to eat healthy      Safety   Seat belts: Yes    Guns: Yes  and secure   Safe in relationships: Yes       Social Determinants of Radio broadcast assistant Strain: Not on file  Food Insecurity: Not on file  Transportation Needs: Not on file  Physical Activity: Not on file  Stress: Not on file  Social Connections: Not on file  Intimate Partner Violence: Not on file    No family history on file.  Review of Systems  Constitutional:  Negative for chills and fever.  HENT:  Negative for congestion and sore throat.   Eyes:  Negative for blurred vision and double vision.  Respiratory:  Negative for shortness of breath.   Cardiovascular:  Negative for chest pain.  Gastrointestinal:  Negative for heartburn, nausea and vomiting.  Genitourinary: Negative.    Musculoskeletal: Negative.  Negative for myalgias.  Skin:  Negative for rash.  Neurological:  Negative for dizziness and headaches.  Endo/Heme/Allergies:  Does not bruise/bleed easily.  Psychiatric/Behavioral:  Negative for depression. The patient is not nervous/anxious.      Physical Exam BP 92/60   Pulse (!) 58   Temp 97.6 F (36.4 C) (Temporal)   Ht 5' 6.5" (1.689 m)   Wt 145 lb 2 oz (65.8 kg)   LMP 11/04/2021 (Exact Date)   SpO2 100%   BMI 23.07 kg/m    BP Readings from Last 3 Encounters:  11/29/21  92/60  11/27/20 98/78  05/21/20 106/64      Physical Exam Constitutional:      General: She is not in acute distress.    Appearance: She is well-developed. She is not diaphoretic.  HENT:     Head: Normocephalic and atraumatic.     Right Ear: External ear normal.     Left Ear: External ear normal.     Nose: Nose normal.  Eyes:     General: No scleral icterus.    Extraocular Movements: Extraocular movements intact.     Conjunctiva/sclera: Conjunctivae normal.  Cardiovascular:     Rate and Rhythm: Normal rate and regular rhythm.     Heart sounds: No murmur heard. Pulmonary:     Effort: Pulmonary effort is normal. No respiratory distress.     Breath sounds: Normal breath sounds. No wheezing.  Abdominal:     General: Bowel sounds are normal. There is no distension.     Palpations: Abdomen is soft. There is no mass.     Tenderness: There is no abdominal tenderness. There is no guarding or rebound.  Musculoskeletal:        General: Normal range of motion.     Cervical back: Neck supple.  Lymphadenopathy:     Cervical: No cervical adenopathy.  Skin:    General: Skin is warm and dry.     Capillary Refill: Capillary refill takes less than 2 seconds.  Neurological:     Mental Status: She is alert and oriented to person, place, and time.     Deep Tendon Reflexes: Reflexes normal.  Psychiatric:        Mood and Affect: Mood normal.        Behavior: Behavior normal.      Results:  PHQ-9:     11/29/2021   10:16 AM 11/27/2020    8:49 AM 12/08/2018    3:39 PM  Depression screen PHQ 2/9  Decreased Interest 0 0 0  Down, Depressed, Hopeless 0 0 0  PHQ - 2 Score 0 0 0       Assessment: 51 y.o. No obstetric history on file. female here for routine annual physical examination.  Plan: Problem List Items Addressed This Visit       Other   Iron deficiency anemia   Relevant Orders   CBC   Ferritin   Other Visit Diagnoses     Annual physical exam    -  Primary   Mixed hyperlipidemia       Relevant Orders   Comprehensive metabolic panel   Lipid panel   Hot flashes       Relevant Orders   TSH       Screening: -- Blood pressure screen normal -- cholesterol screening: will obtain -- Weight screening: normal -- Diabetes Screening: will obtain -- Nutrition: Encouraged healthy diet  The 10-year ASCVD risk score (Arnett DK, et al., 2019) is: 0.5%   Values used to calculate the score:     Age: 19 years     Sex: Female     Is Non-Hispanic African American: No     Diabetic: No     Tobacco smoker: No     Systolic Blood Pressure: 92 mmHg     Is BP treated: No     HDL Cholesterol: 67.9 mg/dL     Total Cholesterol: 182 mg/dL  -- Statin therapy for Age 92-75 with CVD risk >7.5%  Psych -- Depression screening (PHQ-9): neg   Safety -- tobacco screening: not  using -- alcohol screening:  low-risk usage. -- no evidence of domestic violence or intimate partner violence.   Cancer Screening -- pap smear not collected per ASCCP guidelines -- family history of breast cancer screening: done. not at high risk. -- Mammogram -  up to date -- Colon cancer (age 28+)--  up to date  Immunizations Immunization History  Administered Date(s) Administered   Influenza,inj,Quad PF,6+ Mos 01/29/2021   Influenza-Unspecified 03/17/2015, 03/11/2017, 03/16/2019, 03/16/2020   PFIZER(Purple Top)SARS-COV-2 Vaccination 01/03/2020, 02/03/2020    -- flu  vaccine not in season -- TDAP q10 years not up to date - will get later -- Shingles (age >50) not up to date - will schedule later -- Covid-19 Vaccine up to date   Encouraged healthy diet and exercise. Encouraged regular vision and dental care.    Lesleigh Noe, MD

## 2021-12-20 DIAGNOSIS — Z01419 Encounter for gynecological examination (general) (routine) without abnormal findings: Secondary | ICD-10-CM | POA: Diagnosis not present

## 2021-12-20 DIAGNOSIS — Z6823 Body mass index (BMI) 23.0-23.9, adult: Secondary | ICD-10-CM | POA: Diagnosis not present

## 2021-12-20 DIAGNOSIS — Z124 Encounter for screening for malignant neoplasm of cervix: Secondary | ICD-10-CM | POA: Diagnosis not present

## 2021-12-20 DIAGNOSIS — Z1151 Encounter for screening for human papillomavirus (HPV): Secondary | ICD-10-CM | POA: Diagnosis not present

## 2021-12-20 DIAGNOSIS — N841 Polyp of cervix uteri: Secondary | ICD-10-CM | POA: Diagnosis not present

## 2021-12-20 DIAGNOSIS — N840 Polyp of corpus uteri: Secondary | ICD-10-CM | POA: Diagnosis not present

## 2022-01-16 DIAGNOSIS — R9389 Abnormal findings on diagnostic imaging of other specified body structures: Secondary | ICD-10-CM | POA: Diagnosis not present

## 2022-05-13 DIAGNOSIS — D225 Melanocytic nevi of trunk: Secondary | ICD-10-CM | POA: Diagnosis not present

## 2022-05-13 DIAGNOSIS — D2261 Melanocytic nevi of right upper limb, including shoulder: Secondary | ICD-10-CM | POA: Diagnosis not present

## 2022-05-13 DIAGNOSIS — L821 Other seborrheic keratosis: Secondary | ICD-10-CM | POA: Diagnosis not present

## 2022-05-13 DIAGNOSIS — B078 Other viral warts: Secondary | ICD-10-CM | POA: Diagnosis not present

## 2022-06-13 ENCOUNTER — Telehealth: Payer: Self-pay

## 2022-06-13 NOTE — Telephone Encounter (Signed)
Received refill request for     Please call and set up TOC in order for [place] to provide refill. Let me know once scheduled. Thanks!

## 2022-06-13 NOTE — Telephone Encounter (Signed)
LVM for patient to call back and schedule

## 2022-06-17 MED ORDER — PANTOPRAZOLE SODIUM 40 MG PO TBEC: 40.0000 mg | DELAYED_RELEASE_TABLET | Freq: Every day | ORAL | 1 refills | Status: DC

## 2022-06-17 NOTE — Telephone Encounter (Signed)
Pt called to schedule TOC appointment . Would like her medication refill . Please advise # [phone]

## 2022-06-17 NOTE — Addendum Note (Signed)
Addended by: Francella Solian on: 06/17/2022 02:19 PM   Modules accepted: Orders

## 2022-07-30 ENCOUNTER — Encounter: Payer: Self-pay | Admitting: Nurse Practitioner

## 2022-07-30 ENCOUNTER — Ambulatory Visit (INDEPENDENT_AMBULATORY_CARE_PROVIDER_SITE_OTHER): Payer: BC Managed Care – PPO | Admitting: Nurse Practitioner

## 2022-07-30 VITALS — BP 112/68 | HR 81 | Temp 98.0°F | Ht 66.5 in | Wt 152.0 lb

## 2022-07-30 DIAGNOSIS — N920 Excessive and frequent menstruation with regular cycle: Secondary | ICD-10-CM

## 2022-07-30 DIAGNOSIS — K219 Gastro-esophageal reflux disease without esophagitis: Secondary | ICD-10-CM | POA: Diagnosis not present

## 2022-07-30 DIAGNOSIS — D500 Iron deficiency anemia secondary to blood loss (chronic): Secondary | ICD-10-CM

## 2022-07-30 NOTE — Assessment & Plan Note (Signed)
Patient currently maintained on Protonix 40 mg daily.  Tolerates medication well.  Symptoms well-controlled.  Do not see EGD on file.  Continue medication as prescribed

## 2022-07-30 NOTE — Patient Instructions (Signed)
Nice to see you today I have attached information on the shingles vaccine (shingrix) you can call and setup a nurse visit to get that done. It does not require an office visit with me I want to see you in approx 4 months for your physical and labs, sooner if you need me

## 2022-07-30 NOTE — Progress Notes (Signed)
Established Patient Office Visit  Subjective   Patient ID: Jo Cohen, female    DOB: 1970-07-15  Age: 52 y.o. MRN: [id number]  Chief Complaint  Patient presents with   Establish Care    HPI  Transfer of Care: Last seen by Waunita Schooner, MD on 11/29/2021 Last CPE 11/29/2021   GERD: states that she does well on the heartburn medication. States rare flares up.  IDA: states that she is on it three days a week   Dysmenorrhea: regular cycles. That use to be 5-6 days and is followed by GYN and has heavy cycles   Colonoscopy: 2021. Dr Watt Climes. Unsure of recall  Mammo: 11/19/2021 Dr. Delton Coombes Pap: States last year.  Tdap: unsure.  Patient does not want updated today. Flu: UTD Covid: UTD Shingles: Information discussed in office     Review of Systems  Constitutional:  Negative for chills and fever.  Respiratory:  Negative for shortness of breath.   Cardiovascular:  Negative for chest pain and leg swelling.  Gastrointestinal:  Negative for abdominal pain, blood in stool, constipation, diarrhea, nausea and vomiting.       BM daily   Genitourinary:  Negative for dysuria and hematuria.  Neurological:  Negative for tingling and headaches.  Psychiatric/Behavioral:  Negative for hallucinations and suicidal ideas.       Objective:     BP 112/68   Pulse 81   Temp 98 F (36.7 C)   Ht 5' 6.5" (1.689 m)   Wt 152 lb (68.9 kg)   LMP 07/11/2022   SpO2 99%   BMI 24.17 kg/m  BP Readings from Last 3 Encounters:  07/30/22 112/68  11/29/21 92/60  11/27/20 98/78   Wt Readings from Last 3 Encounters:  07/30/22 152 lb (68.9 kg)  11/29/21 145 lb 2 oz (65.8 kg)  11/27/20 145 lb 12 oz (66.1 kg)      Physical Exam Vitals and nursing note reviewed.  Constitutional:      Appearance: Normal appearance.  HENT:     Right Ear: Tympanic membrane, ear canal and external ear normal.     Left Ear: Tympanic membrane, ear canal and external ear normal.     Mouth/Throat:      Mouth: Mucous membranes are moist.     Pharynx: Oropharynx is clear.  Eyes:     Extraocular Movements: Extraocular movements intact.     Pupils: Pupils are equal, round, and reactive to light.  Cardiovascular:     Rate and Rhythm: Normal rate and regular rhythm.     Pulses: Normal pulses.     Heart sounds: Normal heart sounds.  Pulmonary:     Effort: Pulmonary effort is normal.     Breath sounds: Normal breath sounds.  Musculoskeletal:     Right lower leg: No edema.     Left lower leg: No edema.  Lymphadenopathy:     Cervical: No cervical adenopathy.  Skin:    General: Skin is warm.  Neurological:     General: No focal deficit present.     Mental Status: She is alert.     Deep Tendon Reflexes:     Reflex Scores:      Bicep reflexes are 2+ on the right side and 2+ on the left side.      Patellar reflexes are 2+ on the right side and 2+ on the left side.    Comments: Bilateral upper and lower extremity strength 5/5  Psychiatric:  Mood and Affect: Mood normal.        Behavior: Behavior normal.        Thought Content: Thought content normal.        Judgment: Judgment normal.      No results found for any visits on 07/30/22.    The 10-year ASCVD risk score (Arnett DK, et al., 2019) is: 0.9%    Assessment & Plan:   Problem List Items Addressed This Visit       Digestive   GERD (gastroesophageal reflux disease) - Primary    Patient currently maintained on Protonix 40 mg daily.  Tolerates medication well.  Symptoms well-controlled.  Do not see EGD on file.  Continue medication as prescribed        Other   Menorrhagia    Has fairly regular and heavy cycles.  Traditionally 5 to 6 days last was approximately 13 days in length.  She is followed by Dr. Royston Sinner, [place].      Iron deficiency anemia    Secondary to chronic blood loss likely her menorrhagia.  Patient takes ferrous sulfate 325 mg 3 times a week (Monday, Wednesday, Friday).       Return in about 4  months (around 12/01/2022) for CPE and Labs.    Romilda Garret, NP

## 2022-07-30 NOTE — Assessment & Plan Note (Signed)
Secondary to chronic blood loss likely her menorrhagia.  Patient takes ferrous sulfate 325 mg 3 times a week (Monday, Wednesday, Friday).

## 2022-07-30 NOTE — Assessment & Plan Note (Signed)
Has fairly regular and heavy cycles.  Traditionally 5 to 6 days last was approximately 13 days in length.  She is followed by Dr. Royston Sinner, [place].

## 2022-08-15 DIAGNOSIS — Z03818 Encounter for observation for suspected exposure to other biological agents ruled out: Secondary | ICD-10-CM | POA: Diagnosis not present

## 2022-08-15 DIAGNOSIS — J00 Acute nasopharyngitis [common cold]: Secondary | ICD-10-CM | POA: Diagnosis not present

## 2022-08-15 DIAGNOSIS — J069 Acute upper respiratory infection, unspecified: Secondary | ICD-10-CM | POA: Diagnosis not present

## 2022-08-15 DIAGNOSIS — M791 Myalgia, unspecified site: Secondary | ICD-10-CM | POA: Diagnosis not present

## 2022-08-19 ENCOUNTER — Encounter: Payer: Self-pay | Admitting: Nurse Practitioner

## 2022-08-19 NOTE — Telephone Encounter (Signed)
Called and schedule her with Tabitha on 3/6. You did not have any appointments tomorrow.

## 2022-08-19 NOTE — Telephone Encounter (Signed)
Can we get the patient an appointment to be seen please

## 2022-08-20 ENCOUNTER — Ambulatory Visit (INDEPENDENT_AMBULATORY_CARE_PROVIDER_SITE_OTHER): Payer: BC Managed Care – PPO | Admitting: Family

## 2022-08-20 ENCOUNTER — Encounter: Payer: Self-pay | Admitting: Family

## 2022-08-20 VITALS — BP 102/64 | HR 86 | Temp 98.0°F | Ht 66.5 in | Wt 149.2 lb

## 2022-08-20 DIAGNOSIS — J029 Acute pharyngitis, unspecified: Secondary | ICD-10-CM

## 2022-08-20 DIAGNOSIS — J3489 Other specified disorders of nose and nasal sinuses: Secondary | ICD-10-CM | POA: Diagnosis not present

## 2022-08-20 DIAGNOSIS — K2100 Gastro-esophageal reflux disease with esophagitis, without bleeding: Secondary | ICD-10-CM | POA: Insufficient documentation

## 2022-08-20 DIAGNOSIS — J0110 Acute frontal sinusitis, unspecified: Secondary | ICD-10-CM | POA: Diagnosis not present

## 2022-08-20 LAB — POCT RAPID STREP A (OFFICE): Rapid Strep A Screen: NEGATIVE

## 2022-08-20 MED ORDER — AMOXICILLIN-POT CLAVULANATE 875-125 MG PO TABS: 1.0000 | ORAL_TABLET | Freq: Two times a day (BID) | ORAL | 0 refills | Status: DC

## 2022-08-20 MED ORDER — METHYLPREDNISOLONE 4 MG PO TBPK: ORAL_TABLET | ORAL | 0 refills | Status: DC

## 2022-08-20 NOTE — Progress Notes (Signed)
Established Patient Office Visit  Subjective:   Patient ID: Jo Cohen, female    DOB: 1971/01/11  Age: 52 y.o. MRN: [id number]  CC:  Chief Complaint  Patient presents with   Sinus Problem    Headaches, sinus pressure, and ear pain.    HPI: Jo Cohen is a 52 y.o. female presenting on 08/20/2022 for Sinus Problem (Headaches, sinus pressure, and ear pain.)   Sinus Problem    Pt [facility]/1 for headache sinus pressure and sore throat. Tested for flu and covid, both negative.   Today still with sinus pressure, maxillary, and ears are with fullness. She denies fever or chills, fever last Thursday at 100.0 F. Denies cough or chest congestion.   Did take mucinex x 3 days and sudafed, ran out last night and was helping slightly.  She does state today symptoms are improving slightly.            ROS: Negative unless specifically indicated above in HPI.   Relevant past medical history reviewed and updated as indicated.   Allergies and medications reviewed and updated.   Current Outpatient Medications:    amoxicillin-clavulanate (AUGMENTIN) 875-125 MG tablet, Take 1 tablet by mouth 2 (two) times daily., Disp: 20 tablet, Rfl: 0   ferrous sulfate 325 (65 FE) MG tablet, Take 1 tablet (325 mg total) by mouth every Monday, Wednesday, and Friday., Disp: , Rfl:    fluticasone (FLONASE) 50 MCG/ACT nasal spray, 2 sprays Once Daily., Disp: , Rfl:    methylPREDNISolone (MEDROL DOSEPAK) 4 MG TBPK tablet, Take per package instructions, Disp: 21 tablet, Rfl: 0   pantoprazole (PROTONIX) 40 MG tablet, Take 1 tablet (40 mg total) by mouth daily., Disp: 90 tablet, Rfl: 1  No Known Allergies  Objective:   BP 102/64   Pulse 86   Temp 98 F (36.7 C) (Temporal)   Ht 5' 6.5" (1.689 m)   Wt 149 lb 3.2 oz (67.7 kg)   LMP 07/11/2022   SpO2 98%   BMI 23.72 kg/m    Physical Exam Constitutional:      General: She is not in acute distress.     Appearance: Normal appearance. She is normal weight. She is not ill-appearing, toxic-appearing or diaphoretic.  HENT:     Head: Normocephalic.     Right Ear: Tympanic membrane normal.     Left Ear: Tympanic membrane normal.     Nose: Nose normal.     Mouth/Throat:     Mouth: Mucous membranes are dry.     Pharynx: Posterior oropharyngeal erythema present. No oropharyngeal exudate.     Tonsils: No tonsillar abscesses. 0 on the right. 0 on the left.  Eyes:     Extraocular Movements: Extraocular movements intact.     Pupils: Pupils are equal, round, and reactive to light.  Cardiovascular:     Rate and Rhythm: Normal rate and regular rhythm.     Pulses: Normal pulses.     Heart sounds: Normal heart sounds.  Pulmonary:     Effort: Pulmonary effort is normal.     Breath sounds: Normal breath sounds.  Musculoskeletal:     Cervical back: Normal range of motion.  Lymphadenopathy:     Cervical:     Right cervical: No superficial cervical adenopathy.    Left cervical: No superficial cervical adenopathy.  Neurological:     General: No focal deficit present.     Mental Status: She is alert and oriented to  person, place, and time. Mental status is at baseline.  Psychiatric:        Mood and Affect: Mood normal.        Behavior: Behavior normal.        Thought Content: Thought content normal.        Judgment: Judgment normal.     Assessment & Plan:  Sore throat -     POCT rapid strep A  Sinus pressure -     methylPREDNISolone; Take per package instructions  Dispense: 21 tablet; Refill: 0  Acute non-recurrent frontal sinusitis Assessment & Plan: Strep negative in office.  Suspect viral, however sent in rx augmentin 875/125 mg po bid x 10 days In case there is no resolution in the next two days.  Advised patient on supportive measures:  Be sure to rest, drink plenty of fluids, and use tylenol or ibuprofen as needed for pain. Follow up if fever >101, if symptoms worsen or if symptoms are  not improved in 3 days. Patient verbalizes understanding.     Orders: -     Amoxicillin-Pot Clavulanate; Take 1 tablet by mouth 2 (two) times daily.  Dispense: 20 tablet; Refill: 0     Follow up plan: Return for f/u with primary care provider if no improvement.  Eugenia Pancoast, FNP

## 2022-08-20 NOTE — Assessment & Plan Note (Signed)
Strep negative in office.  Suspect viral, however sent in rx augmentin 875/125 mg po bid x 10 days In case there is no resolution in the next two days.  Advised patient on supportive measures:  Be sure to rest, drink plenty of fluids, and use tylenol or ibuprofen as needed for pain. Follow up if fever >101, if symptoms worsen or if symptoms are not improved in 3 days. Patient verbalizes understanding.

## 2022-08-20 NOTE — Patient Instructions (Signed)
  Antibiotic sent to preferred pharmacy I would hold off for 2 days or so if symptoms worsen you can start, as this is suspected to be viral.  Start the steroid pack today, as this may greatly help the sinus pressure.   Please increase oral fluids, steamy hot shower/humidifier prn.  Please follow up if no improvement in 2-3 days.   It was a pleasure seeing you today! Please do not hesitate to reach out with any questions and or concerns.  Regards,   Eugenia Pancoast

## 2022-10-06 ENCOUNTER — Other Ambulatory Visit: Payer: Self-pay | Admitting: Obstetrics and Gynecology

## 2022-10-06 DIAGNOSIS — Z1231 Encounter for screening mammogram for malignant neoplasm of breast: Secondary | ICD-10-CM

## 2022-11-24 ENCOUNTER — Ambulatory Visit
Admission: RE | Admit: 2022-11-24 | Discharge: 2022-11-24 | Disposition: A | Discharge status: Home / Self Care | Payer: BC Managed Care – PPO | Source: Ambulatory Visit | Attending: Obstetrics and Gynecology | Admitting: Obstetrics and Gynecology

## 2022-11-24 DIAGNOSIS — Z1231 Encounter for screening mammogram for malignant neoplasm of breast: Secondary | ICD-10-CM | POA: Diagnosis not present

## 2022-12-02 ENCOUNTER — Ambulatory Visit (INDEPENDENT_AMBULATORY_CARE_PROVIDER_SITE_OTHER): Payer: BC Managed Care – PPO | Admitting: Nurse Practitioner

## 2022-12-02 ENCOUNTER — Encounter: Payer: Self-pay | Admitting: Nurse Practitioner

## 2022-12-02 VITALS — BP 110/64 | HR 73 | Temp 98.0°F | Resp 16 | Ht 66.25 in | Wt 146.0 lb

## 2022-12-02 DIAGNOSIS — E7800 Pure hypercholesterolemia, unspecified: Secondary | ICD-10-CM

## 2022-12-02 DIAGNOSIS — Z0000 Encounter for general adult medical examination without abnormal findings: Secondary | ICD-10-CM

## 2022-12-02 DIAGNOSIS — K219 Gastro-esophageal reflux disease without esophagitis: Secondary | ICD-10-CM | POA: Diagnosis not present

## 2022-12-02 DIAGNOSIS — D500 Iron deficiency anemia secondary to blood loss (chronic): Secondary | ICD-10-CM | POA: Diagnosis not present

## 2022-12-02 LAB — IBC + FERRITIN
Ferritin: 14.2 ng/mL (ref 10.0–291.0)
Iron: 131 ug/dL (ref 42–145)
Saturation Ratios: 37.3 % (ref 20.0–50.0)
TIBC: 351.4 ug/dL (ref 250.0–450.0)
Transferrin: 251 mg/dL (ref 212.0–360.0)

## 2022-12-02 LAB — COMPREHENSIVE METABOLIC PANEL WITH GFR
ALT: 18 U/L (ref 0–35)
AST: 21 U/L (ref 0–37)
Albumin: 4.2 g/dL (ref 3.5–5.2)
Alkaline Phosphatase: 48 U/L (ref 39–117)
BUN: 17 mg/dL (ref 6–23)
CO2: 27 meq/L (ref 19–32)
Calcium: 9.1 mg/dL (ref 8.4–10.5)
Chloride: 103 meq/L (ref 96–112)
Creatinine, Ser: 0.83 mg/dL (ref 0.40–1.20)
GFR: 80.52 mL/min
Glucose, Bld: 94 mg/dL (ref 70–99)
Potassium: 4.7 meq/L (ref 3.5–5.1)
Sodium: 136 meq/L (ref 135–145)
Total Bilirubin: 1.1 mg/dL (ref 0.2–1.2)
Total Protein: 6.9 g/dL (ref 6.0–8.3)

## 2022-12-02 LAB — LIPID PANEL
Cholesterol: 194 mg/dL (ref 0–200)
HDL: 68.2 mg/dL (ref >39.00)
LDL Cholesterol: 117 mg/dL — ABNORMAL HIGH (ref 0–99)
NonHDL: 125.96
Total CHOL/HDL Ratio: 3
Triglycerides: 47 mg/dL (ref 0.0–149.0)
VLDL: 9.4 mg/dL (ref 0.0–40.0)

## 2022-12-02 LAB — CBC
HCT: 44.5 % (ref 36.0–46.0)
Hemoglobin: 14.6 g/dL (ref 12.0–15.0)
MCHC: 32.8 g/dL (ref 30.0–36.0)
MCV: 95.3 fl (ref 78.0–100.0)
Platelets: 236 K/uL (ref 150.0–400.0)
RBC: 4.67 Mil/uL (ref 3.87–5.11)
RDW: 13.2 % (ref 11.5–15.5)
WBC: 4.4 K/uL (ref 4.0–10.5)

## 2022-12-02 LAB — TSH: TSH: 1.29 u[IU]/mL (ref 0.35–5.50)

## 2022-12-02 NOTE — Assessment & Plan Note (Signed)
Secondary to chronic blood loss from heavy periods.  Patient still experiencing heavy.  Does take iron Monday, Wednesday, Friday.  Menstrual cycles been regular

## 2022-12-02 NOTE — Assessment & Plan Note (Signed)
Discussed age-appropriate immunizations and screening exams.  Did review patient's personal, surgical, social, family histories.  Patient up-to-date on all vaccinations she would like for her age range.  Patient refused tetanus vaccine information discussed and given at dismissal about shingles vaccine.  Patient's up-to-date on CRC screening, breast cancer screening, cervical cancer screening.  Patient was given information at discharge about preventative healthcare maintenance with anticipatory guidance.

## 2022-12-02 NOTE — Assessment & Plan Note (Signed)
Patient currently maintained on Protonix 40 mg daily.  Has underwent EGD in the past.  Patient states she tried to cut down on medication but had breakthrough symptoms continue medication as prescribed

## 2022-12-02 NOTE — Assessment & Plan Note (Signed)
History of the same.  Patient has been actively working on lifestyle modifications and lost some weight.  Pending lipid panel

## 2022-12-02 NOTE — Patient Instructions (Signed)
Nice to see you today I will be in touch with the labs once I have reviewed them Follow up with me in 1 year, sooner if you need me  Call and make a nurse visit to get your first shingles vaccine

## 2022-12-02 NOTE — Progress Notes (Signed)
Established Patient Office Visit  Subjective   Patient ID: Jo Cohen, female    DOB: 04-Jul-1970  Age: 52 y.o. MRN: [id number]  Chief Complaint  Patient presents with   Annual Exam    HPI  for complete physical and follow up of chronic conditions.   GERD: controlled. States that she has tried to take less and it did not help. Hx of egd 2021   IDA: heavy periods and on the iron    Immunizations: -Tetanus: Refused  -Influenza: Completed this season -Shingles:  information discussed and printed out at the end of visit -Pneumonia: Too young - covid: Pfizer x 2  Diet: Fair diet. 3 meals a day. Sometimes she snacks. water Exercise:  3 days a week boot camp 40 mins. And walks daily for an hour   Eye exam: Completes annually. Glasses and contacts  Dental exam: Completes semi-annually    Colonoscopy: Completed in 2021 with 10 year recall Lung Cancer Screening: N/A  Pap smear: [name], last year with history of abnormal Paps in the past   Mammogram: 11/24/2022, done through Dr. Velvet Bathe  Sleep: states that she will go to bed 11-130 and will falll asleep before. She will get up 5-6. Feels rested. Does not snore     Review of Systems  Constitutional:  Negative for chills and fever.  Respiratory:  Negative for shortness of breath.   Cardiovascular:  Negative for chest pain and leg swelling.  Gastrointestinal:  Negative for abdominal pain, blood in stool, constipation, diarrhea, nausea and vomiting.       BM daily   Genitourinary:  Negative for dysuria and hematuria.  Neurological:  Negative for tingling and headaches.  Psychiatric/Behavioral:  Negative for hallucinations and suicidal ideas.       Objective:     BP 110/64   Pulse 73   Temp 98 F (36.7 C)   Resp 16   Ht 5' 6.25" (1.683 m)   Wt 146 lb (66.2 kg)   LMP 11/05/2022 (Approximate)   SpO2 97%   BMI 23.39 kg/m  BP Readings from Last 3 Encounters:  12/02/22 110/64  08/20/22 102/64  07/30/22  112/68   Wt Readings from Last 3 Encounters:  12/02/22 146 lb (66.2 kg)  08/20/22 149 lb 3.2 oz (67.7 kg)  07/30/22 152 lb (68.9 kg)      Physical Exam Vitals and nursing note reviewed.  Constitutional:      Appearance: Normal appearance.  HENT:     Right Ear: Tympanic membrane, ear canal and external ear normal.     Left Ear: Tympanic membrane, ear canal and external ear normal.     Mouth/Throat:     Mouth: Mucous membranes are moist.     Pharynx: Oropharynx is clear.  Eyes:     Extraocular Movements: Extraocular movements intact.     Pupils: Pupils are equal, round, and reactive to light.  Cardiovascular:     Rate and Rhythm: Normal rate and regular rhythm.     Pulses: Normal pulses.     Heart sounds: Normal heart sounds.  Pulmonary:     Effort: Pulmonary effort is normal.     Breath sounds: Normal breath sounds.  Abdominal:     General: Bowel sounds are normal. There is no distension.     Palpations: There is no mass.     Tenderness: There is no abdominal tenderness.     Hernia: No hernia is present.  Musculoskeletal:     Right lower leg:  No edema.     Left lower leg: No edema.  Lymphadenopathy:     Cervical: No cervical adenopathy.  Skin:    General: Skin is warm.  Neurological:     General: No focal deficit present.     Mental Status: She is alert.     Deep Tendon Reflexes:     Reflex Scores:      Bicep reflexes are 2+ on the right side and 2+ on the left side.      Patellar reflexes are 2+ on the right side and 2+ on the left side.    Comments: Bilateral upper and lower extremity strength 5/5  Psychiatric:        Mood and Affect: Mood normal.        Behavior: Behavior normal.        Thought Content: Thought content normal.        Judgment: Judgment normal.      No results found for any visits on 12/02/22.    The 10-year ASCVD risk score (Arnett DK, et al., 2019) is: 0.9%    Assessment & Plan:   Problem List Items Addressed This Visit        Digestive   GERD (gastroesophageal reflux disease)    Patient currently maintained on Protonix 40 mg daily.  Has underwent EGD in the past.  Patient states she tried to cut down on medication but had breakthrough symptoms continue medication as prescribed        Other   Iron deficiency anemia - Primary    Secondary to chronic blood loss from heavy periods.  Patient still experiencing heavy.  Does take iron Monday, Wednesday, Friday.  Menstrual cycles been regular      Relevant Orders   IBC + Ferritin   Preventative health care    Discussed age-appropriate immunizations and screening exams.  Did review patient's personal, surgical, social, family histories.  Patient up-to-date on all vaccinations she would like for her age range.  Patient refused tetanus vaccine information discussed and given at dismissal about shingles vaccine.  Patient's up-to-date on CRC screening, breast cancer screening, cervical cancer screening.  Patient was given information at discharge about preventative healthcare maintenance with anticipatory guidance.      Relevant Orders   CBC   Comprehensive metabolic panel   TSH   Elevated LDL cholesterol level    History of the same.  Patient has been actively working on lifestyle modifications and lost some weight.  Pending lipid panel      Relevant Orders   Lipid panel    Return in about 1 year (around 12/02/2023) for CPE and Labs.    Audria Nine, NP

## 2022-12-11 ENCOUNTER — Other Ambulatory Visit: Payer: Self-pay | Admitting: Family

## 2022-12-17 NOTE — Telephone Encounter (Signed)
Patient contacted the office regarding this medication refill request, asked for a follow up. Patient says she has one day of this medication left. Please advise, thank you

## 2022-12-17 NOTE — Telephone Encounter (Signed)
Looks like the refill requests were going to [employer], NP as she was doing Dr Elmyra Ricks refills for [place] for awhile. Will forward to [facility] to complete.

## 2022-12-19 ENCOUNTER — Encounter: Payer: Self-pay | Admitting: Nurse Practitioner

## 2022-12-19 NOTE — Telephone Encounter (Signed)
Medication: Pantoprazole mg   Directions: take 1 tablet po daily  Last given: / Number refills: 06/17/22 Last o/v: 12/02/22 Follow up: 1 year  Labs: 12/02/22

## 2022-12-19 NOTE — Telephone Encounter (Signed)
Patient contacted the office regarding this request again, states she is out of this medication now. Is needing a refill before the weekend. Please advise, thank you.

## 2022-12-25 DIAGNOSIS — Z1151 Encounter for screening for human papillomavirus (HPV): Secondary | ICD-10-CM | POA: Diagnosis not present

## 2022-12-25 DIAGNOSIS — Z6823 Body mass index (BMI) 23.0-23.9, adult: Secondary | ICD-10-CM | POA: Diagnosis not present

## 2022-12-25 DIAGNOSIS — Z124 Encounter for screening for malignant neoplasm of cervix: Secondary | ICD-10-CM | POA: Diagnosis not present

## 2022-12-25 DIAGNOSIS — Z01419 Encounter for gynecological examination (general) (routine) without abnormal findings: Secondary | ICD-10-CM | POA: Diagnosis not present

## 2023-01-21 ENCOUNTER — Telehealth: Payer: Self-pay | Admitting: Nurse Practitioner

## 2023-01-21 NOTE — Telephone Encounter (Signed)
The visit was coded for preventative health. I have forwarded the message to the billing department

## 2023-01-21 NOTE — Telephone Encounter (Signed)
Trinity from [facility] [place] called requesting a coding review from the pt's cpe on 12/02/22? Trinity stated the pt states she had a cpe done by [facility] but the coding submitted was suppose to be preventative but was diagnostic. Call back # [phone]

## 2023-01-22 NOTE — Telephone Encounter (Signed)
Contacted pt and relayed information. Pt has no questions or concerns.

## 2023-01-27 DIAGNOSIS — L300 Nummular dermatitis: Secondary | ICD-10-CM | POA: Diagnosis not present

## 2023-03-06 DIAGNOSIS — M7531 Calcific tendinitis of right shoulder: Secondary | ICD-10-CM | POA: Diagnosis not present

## 2023-05-12 DIAGNOSIS — D225 Melanocytic nevi of trunk: Secondary | ICD-10-CM | POA: Diagnosis not present

## 2023-05-12 DIAGNOSIS — L821 Other seborrheic keratosis: Secondary | ICD-10-CM | POA: Diagnosis not present

## 2023-05-12 DIAGNOSIS — Z85828 Personal history of other malignant neoplasm of skin: Secondary | ICD-10-CM | POA: Diagnosis not present

## 2023-05-12 DIAGNOSIS — L814 Other melanin hyperpigmentation: Secondary | ICD-10-CM | POA: Diagnosis not present

## 2023-05-26 DIAGNOSIS — N939 Abnormal uterine and vaginal bleeding, unspecified: Secondary | ICD-10-CM | POA: Diagnosis not present

## 2023-05-26 DIAGNOSIS — N924 Excessive bleeding in the premenopausal period: Secondary | ICD-10-CM | POA: Diagnosis not present

## 2023-06-04 ENCOUNTER — Other Ambulatory Visit: Payer: Self-pay | Admitting: Nurse Practitioner

## 2023-06-04 ENCOUNTER — Encounter: Payer: Self-pay | Admitting: Nurse Practitioner

## 2023-06-04 ENCOUNTER — Ambulatory Visit: Payer: BC Managed Care – PPO | Admitting: Nurse Practitioner

## 2023-06-04 VITALS — BP 118/64 | HR 92 | Temp 98.4°F | Ht 66.25 in | Wt 152.5 lb

## 2023-06-04 DIAGNOSIS — J029 Acute pharyngitis, unspecified: Secondary | ICD-10-CM

## 2023-06-04 DIAGNOSIS — R52 Pain, unspecified: Secondary | ICD-10-CM

## 2023-06-04 DIAGNOSIS — J3489 Other specified disorders of nose and nasal sinuses: Secondary | ICD-10-CM | POA: Diagnosis not present

## 2023-06-04 DIAGNOSIS — R051 Acute cough: Secondary | ICD-10-CM | POA: Diagnosis not present

## 2023-06-04 DIAGNOSIS — R0609 Other forms of dyspnea: Secondary | ICD-10-CM

## 2023-06-04 LAB — POCT FLU A/B STATUS
Influenza A, POC: NEGATIVE
Influenza B, POC: NEGATIVE

## 2023-06-04 LAB — POCT RAPID STREP A (OFFICE): Rapid Strep A Screen: NEGATIVE

## 2023-06-04 LAB — POC COVID19 BINAXNOW: SARS Coronavirus 2 Ag: NEGATIVE

## 2023-06-04 MED ORDER — ALBUTEROL SULFATE HFA 108 (90 BASE) MCG/ACT IN AERS
2.0000 | INHALATION_SPRAY | Freq: Four times a day (QID) | RESPIRATORY_TRACT | 0 refills | Status: AC | PRN
Start: 2023-06-04 — End: ?

## 2023-06-04 MED ORDER — PREDNISONE 20 MG PO TABS
ORAL_TABLET | ORAL | 0 refills | Status: AC
Start: 2023-06-04 — End: 2023-06-10

## 2023-06-04 NOTE — Assessment & Plan Note (Signed)
Strep, COVID, flu test negative in office.  Patient can use over-the-counter analgesics as needed, warm salt water, throat lozenges.  Drink plenty of fluids

## 2023-06-04 NOTE — Assessment & Plan Note (Signed)
Flu and COVID test in office.  Over-the-counter analgesics as needed

## 2023-06-04 NOTE — Patient Instructions (Signed)
Nice to see you today Strep, Covid and flu test was all negative today Warm salt water gargles, throat lozenges and over the counter analgesics as needed Start taking the Flonase Reach out to me if you are not improving by the first of the week

## 2023-06-04 NOTE — Assessment & Plan Note (Signed)
Prednisone taper as prescribed.  Prednisone precautions reviewed with patient.

## 2023-06-04 NOTE — Progress Notes (Signed)
Acute Office Visit  Subjective:     Patient ID: Jo Cohen, female    DOB: 01-09-1971, 52 y.o.   MRN: [id number]  Chief Complaint  Patient presents with   Sore Throat    C/o ST, sinus pressure and B ear pressure. Denies fever. Sxs started 05/26/23.     Patient is in today for sick symptoms with a history of GERD, dysmenorrhea, IDA, elevated LDL.  No history of smoking  COVID vaccines: Original series Flu vaccine: up to date  No sick contacts Has tried tylenol and alka seltzer plus that has not helped   Review of Systems  Constitutional:  Positive for malaise/fatigue. Negative for chills and fever.  HENT:  Positive for ear pain, sinus pain and sore throat.   Respiratory:  Positive for cough and shortness of breath (doe). Negative for sputum production.   Gastrointestinal:  Negative for abdominal pain, constipation, diarrhea, nausea and vomiting.  Musculoskeletal:  Positive for joint pain and myalgias.  Neurological:  Negative for headaches.        Objective:    BP 118/64   Pulse 92   Temp 98.4 F (36.9 C) (Oral)   Ht 5' 6.25" (1.683 m)   Wt 152 lb 8 oz (69.2 kg)   LMP 05/07/2023   SpO2 97%   BMI 24.43 kg/m    Physical Exam Vitals and nursing note reviewed.  Constitutional:      Appearance: Normal appearance.  HENT:     Right Ear: Tympanic membrane, ear canal and external ear normal.     Left Ear: Tympanic membrane, ear canal and external ear normal.     Nose:     Right Sinus: No maxillary sinus tenderness or frontal sinus tenderness.     Left Sinus: No maxillary sinus tenderness or frontal sinus tenderness.     Mouth/Throat:     Mouth: Mucous membranes are moist.     Pharynx: Oropharynx is clear.  Cardiovascular:     Rate and Rhythm: Normal rate and regular rhythm.     Heart sounds: Normal heart sounds.  Pulmonary:     Effort: Pulmonary effort is normal.     Breath sounds: Normal breath sounds.  Lymphadenopathy:     Cervical: No cervical  adenopathy.  Neurological:     Mental Status: She is alert.     Results for orders placed or performed in visit on 06/04/23  POCT rapid strep A  Result Value Ref Range   Rapid Strep A Screen Negative Negative  POC COVID-19  Result Value Ref Range   SARS Coronavirus 2 Ag Negative Negative  POCT Flu A & B Status  Result Value Ref Range   Influenza A, POC Negative Negative   Influenza B, POC Negative Negative        Assessment & Plan:   Problem List Items Addressed This Visit       Respiratory   Viral pharyngitis   Strep, COVID, flu test negative in office.  Patient can use over-the-counter analgesics as needed, warm salt water, throat lozenges.  Drink plenty of fluids        Other   Sinus pressure   Prednisone taper as prescribed.  Prednisone precautions reviewed with patient      Relevant Medications   predniSONE (DELTASONE) 20 MG tablet   Sore throat - Primary   Strep, COVID, flu test in office.  Patient use over-the-counter analgesia as needed      Relevant Orders   POCT rapid  strep A (Completed)   POC COVID-19 (Completed)   POCT Flu A & B Status (Completed)   Body aches   Flu and COVID test in office.  Over-the-counter analgesics as needed      Relevant Orders   POC COVID-19 (Completed)   POCT Flu A & B Status (Completed)   Acute cough   Over-the-counter cough suppressant as needed.  Did offer to send in a prescription cough medication patient politely declined.  Flu and COVID test in office      Relevant Orders   POC COVID-19 (Completed)   POCT Flu A & B Status (Completed)   Other Visit Diagnoses       DOE (dyspnea on exertion)       Relevant Medications   albuterol (VENTOLIN HFA) 108 (90 Base) MCG/ACT inhaler       Meds ordered this encounter  Medications   predniSONE (DELTASONE) 20 MG tablet    Sig: Take 2 tablets (40 mg total) by mouth daily with breakfast for 3 days, THEN 1 tablet (20 mg total) daily with breakfast for 3 days. Avoid  NSAIDs.    Dispense:  9 tablet    Refill:  0    Supervising Provider:   Roxy Manns A [[id number]]   albuterol (VENTOLIN HFA) 108 (90 Base) MCG/ACT inhaler    Sig: Inhale 2 puffs into the lungs every 6 (six) hours as needed for wheezing or shortness of breath.    Dispense:  8 g    Refill:  0    Supervising Provider:   Roxy Manns A [[id number]]    Return if symptoms worsen or fail to improve.  Audria Nine, NP

## 2023-06-04 NOTE — Assessment & Plan Note (Signed)
Over-the-counter cough suppressant as needed.  Did offer to send in a prescription cough medication patient politely declined.  Flu and COVID test in office

## 2023-06-04 NOTE — Assessment & Plan Note (Signed)
Strep, COVID, flu test in office.  Patient use over-the-counter analgesia as needed

## 2023-06-07 ENCOUNTER — Encounter: Payer: Self-pay | Admitting: Nurse Practitioner

## 2023-06-08 ENCOUNTER — Ambulatory Visit: Payer: Self-pay | Admitting: Nurse Practitioner

## 2023-06-08 ENCOUNTER — Encounter: Payer: Self-pay | Admitting: Family Medicine

## 2023-06-08 ENCOUNTER — Ambulatory Visit: Payer: BC Managed Care – PPO | Admitting: Family Medicine

## 2023-06-08 VITALS — BP 120/80 | HR 90 | Temp 98.6°F | Ht 66.25 in | Wt 152.2 lb

## 2023-06-08 DIAGNOSIS — B9689 Other specified bacterial agents as the cause of diseases classified elsewhere: Secondary | ICD-10-CM

## 2023-06-08 DIAGNOSIS — J019 Acute sinusitis, unspecified: Secondary | ICD-10-CM

## 2023-06-08 MED ORDER — FLUCONAZOLE 150 MG PO TABS: 150.0000 mg | ORAL_TABLET | Freq: Once | ORAL | 0 refills | Status: AC

## 2023-06-08 MED ORDER — AMOXICILLIN-POT CLAVULANATE 875-125 MG PO TABS: 1.0000 | ORAL_TABLET | Freq: Two times a day (BID) | ORAL | 0 refills | Status: AC

## 2023-06-08 NOTE — Telephone Encounter (Signed)
Copied from CRM [id number]. Topic: Clinical - Medical Advice >> Jun 08, 2023 11:25 AM Alona Bene A wrote: Reason for CRM: Patients husband reached out for help with MyChart message that his wife sent. Wife is having trouble speaking now and needs to speak with provider. Please contact husband at [phone].   Chief Complaint: Lost voice/hoarseness, worsening overall symptoms Symptoms: Hoarseness/lost voice, runny nose, yellow eye drainage, sore throat, congestion, cough sometimes productive, chest hurts only when coughing Frequency: Constant, progressively worsening Pertinent Negatives: Patient denies SOB, chest pain at rest Disposition: [] ED /[] Urgent Care (no appt availability in office) / [x] Appointment(In office/virtual)/ []  [facility]/ [] Home Care/ [] Refused Recommended Disposition /[] [facility]/ []  Follow-up with PCP Additional Notes: Husband reporting that pt's voice is like a "little whistle," "can squeak out a word every once and a while," but "nothing comes out" when tries to talk. Husband reporting hoarseness began Thursday and progressively worse since then, "yesterday couldn't talk at all." Husband confirms "dry cough," "gets some stuff up every once and a while," tries to avoid coughing because her "chest hurts from coughing so much," confirms only hurts when coughing. Husband denies fever. Husband reporting congestion and doing "a lot of mouth breathing," denies rash, sore throat "probably 6" out 10 confirms not crying from the pain of throat. Husband confirms she is eating soft foods and drinking a lot, "warm things help." Pt reporting yellowish drainage from her eyes, eyes are "puffy," not sleeping well, runny nose. Advised pt be examined again today, husband agreed. No availability with PCP today, scheduled with [facility]. Husband verbalized understanding to call if worsening.  Reason for Disposition  SEVERE sore throat pain  Answer  Assessment - Initial Assessment Questions 1. DESCRIPTION: "Describe your voice." (e.g., coarse, raspy, weaker, airy, scratchy, deeper)     Husband reporting that her voice is like a "little whistle," "can squeak out a word every once and a while," but "nothing comes out" when tries to talk. 3. ONSET: "When did the hoarseness begin?"     Husband reporting hoarseness began Thursday and progressively worse since then, "yesterday couldn't talk at all." 4. COUGH: "Is there a cough?" If Yes, ask: "How bad is it?"     Husband confirms "dry cough," "gets some stuff up every once and a while," tries to avoid coughing because her "chest hurts from coughing so much," confirms only hurts when coughing. 5. FEVER: "Do you have a fever?" If Yes, ask: "What is your temperature, how was it measured, and when did it start?"     Husband denies fever. 9. OTHER SYMPTOMS: "Do you have any other symptoms?" (e.g., breathing difficulty, fever, foreign body, lymph node swelling in neck, rash, sore throat, weight loss)    Husband reporting congestion and doing "a lot of mouth breathing," denies rash, sore throat, confirms not crying from the pain. Husband confirms she is eating soft foods and drinking a lot, "warm things help." Pt reporting yellowish drainage from her eyes, eyes are "puffy," not sleeping well, runny nose.  Protocols used: [facility]

## 2023-06-08 NOTE — Progress Notes (Signed)
Subjective:    Patient ID: Jo Cohen, female    DOB: [birth year], 52 y.o.   MRN: [id number]  Cough   Patient presents runny nose head congestion and sinus pressure.  This been going on for more than a week.  She also pain in her maxillary sinuses.  Her teeth hurt.  She also worse.  Call productive of yellow mucus.  She denies any shortness of breath.  She denies any chest pain.  She denies any nausea or vomiting. Past Medical History:  Diagnosis Date   GERD (gastroesophageal reflux disease)    Hemorrhoids    Past Surgical History:  Procedure Laterality Date   APPENDECTOMY  2007   CESAREAN SECTION  1998   Current Outpatient Medications on File Prior to Visit  Medication Sig Dispense Refill   albuterol (VENTOLIN HFA) 108 (90 Base) MCG/ACT inhaler Inhale 2 puffs into the lungs every 6 (six) hours as needed for wheezing or shortness of breath. 8 g 0   ferrous sulfate 325 (65 FE) MG tablet Take 1 tablet (325 mg total) by mouth every Monday, Wednesday, and Friday.     fluticasone (FLONASE) 50 MCG/ACT nasal spray      pantoprazole (PROTONIX) 40 MG tablet TAKE 1 TABLET BY MOUTH EVERY DAY 90 tablet 1   predniSONE (DELTASONE) 20 MG tablet Take 2 tablets (40 mg total) by mouth daily with breakfast for 3 days, THEN 1 tablet (20 mg total) daily with breakfast for 3 days. Avoid NSAIDs. 9 tablet 0   No current facility-administered medications on file prior to visit.   No Known Allergies Social History   Socioeconomic History   Marital status: Married    Spouse name: Thayer Ohm   Number of children: 3   Years of education: college   Highest education level: Not on file  Occupational History   Not on file  Tobacco Use   Smoking status: Never   Smokeless tobacco: Never  Vaping Use   Vaping status: Never Used  Substance and Sexual Activity   Alcohol use: Not Currently    Comment: 1 or 2 per month   Drug use: No   Sexual activity: Yes    Birth control/protection: Surgical     Comment: vasectomy  Other Topics Concern   Not on file  Social History Narrative   11/27/20   From: the area   Living: with husband, Thayer Ohm (2015)   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       Family: 3 children (triplets) - Lillia Abed, Sheria Lang, Ivin Booty (1998)       Enjoys: outdoor activities - hiking, camping, bike riding, walking the dog      Exercise: gym - 3 days a week, and walking daily 30-40 minutes   Diet: generally good, tries to eat healthy      Safety   Seat belts: Yes    Guns: Yes  and secure   Safe in relationships: Yes       Social Drivers of Corporate investment banker Strain: Not on file  Food Insecurity: Not on file  Transportation Needs: Not on file  Physical Activity: Sufficiently Active (06/08/2023)   Exercise Vital Sign    Days of Exercise per Week: 5 days    Minutes of Exercise per Session: 30 min  Stress: No Stress Concern Present (06/08/2023)   [employer] of Occupational Health - Occupational Stress Questionnaire    Feeling of Stress : Not at all  Social Connections: Not on file  Intimate Partner Violence: Not on file     Review of Systems  Respiratory:  Positive for cough.   All other systems reviewed and are negative.      Objective:   Physical Exam Vitals reviewed.  Constitutional:      Appearance: Normal appearance. She is normal weight.  HENT:     Right Ear: Tympanic membrane and ear canal normal.     Left Ear: Tympanic membrane and ear canal normal.     Nose: Congestion and rhinorrhea present.     Right Sinus: Maxillary sinus tenderness present.     Left Sinus: Maxillary sinus tenderness present.     Mouth/Throat:     Pharynx: No oropharyngeal exudate or posterior oropharyngeal erythema.  Eyes:     Extraocular Movements: Extraocular movements intact.     Conjunctiva/sclera: Conjunctivae normal.     Pupils: Pupils are equal, round, and reactive to light.  Cardiovascular:     Rate and Rhythm: Normal rate and regular rhythm.      Pulses: Normal pulses.     Heart sounds: Normal heart sounds. No murmur heard.    No friction rub. No gallop.  Pulmonary:     Effort: Pulmonary effort is normal. No respiratory distress.     Breath sounds: Normal breath sounds. No wheezing, rhonchi or rales.  Lymphadenopathy:     Cervical: No cervical adenopathy.  Neurological:     Mental Status: She is alert.           Assessment & Plan:   Acute bacterial rhinosinusitis Due to the pain in her teeth in her maxillary sinuses, I believe the patient developed a secondary bacterial sinus infection.  Begin Augmentin 875 mg twice daily for 10 days.  She can use Diflucan if she gets a yeast infection.

## 2023-06-08 NOTE — Telephone Encounter (Signed)
Pt has been scheduled to be seen today [employer].

## 2023-06-08 NOTE — Telephone Encounter (Signed)
Noted.  Appreciate Dr. Tanya Nones evaluating patient

## 2023-07-16 ENCOUNTER — Encounter: Payer: Self-pay | Admitting: Nurse Practitioner

## 2023-07-17 NOTE — Telephone Encounter (Signed)
Can we gather more information. I did see her and so did Dr. Tanya Nones

## 2023-07-17 NOTE — Telephone Encounter (Signed)
Update on cough; I spoke with pt; pt last saw Audria Nine on 06/04/23 and Dr Tanya Nones on 06/08/23. Pt is taking the protonix 40 mg daily and has not missed taking med. Pt uses flonase nasal spray 3 x a wk; pt does not use daily. Pt after finishing abx the mucus from head and chest no longer has color. Pt said OTC mucinex does not help.Pt does not have fever,  wheezing,CP,SOB or H/A.  On and off pt wakes up with S/T but the S/T disappears during the day.pt said only in evening pt develops dry cough and hoarseness. Pt said this has been going on for weeks and is very annoying.pt is in no distress just wants to get rid of cough and hoarseness. Pt request my chart response after note reviewed by Iven Finn NP. [facility]. Sending note to Audria Nine NP and [place] pool.

## 2023-09-08 DIAGNOSIS — L300 Nummular dermatitis: Secondary | ICD-10-CM | POA: Diagnosis not present

## 2023-10-12 ENCOUNTER — Other Ambulatory Visit: Payer: Self-pay | Admitting: Obstetrics and Gynecology

## 2023-10-12 DIAGNOSIS — Z1231 Encounter for screening mammogram for malignant neoplasm of breast: Secondary | ICD-10-CM

## 2023-11-25 ENCOUNTER — Ambulatory Visit
Admission: RE | Admit: 2023-11-25 | Discharge: 2023-11-25 | Disposition: A | Discharge status: Home / Self Care | Source: Ambulatory Visit | Attending: Obstetrics and Gynecology | Admitting: Obstetrics and Gynecology

## 2023-11-25 DIAGNOSIS — Z1231 Encounter for screening mammogram for malignant neoplasm of breast: Secondary | ICD-10-CM | POA: Diagnosis not present

## 2023-12-08 ENCOUNTER — Other Ambulatory Visit: Payer: Self-pay | Admitting: Nurse Practitioner

## 2023-12-09 NOTE — Telephone Encounter (Signed)
 Spoke to patient advised she needs to schedule CPE in next 30 days she asked why, advised to continue to receive refills.  Stated she was driving and would call back to schedule.

## 2023-12-09 NOTE — Telephone Encounter (Signed)
 Patient is over due for a CPE. Needs CPE in next 30 days to continue getting refills

## 2023-12-10 ENCOUNTER — Telehealth: Payer: Self-pay | Admitting: Nurse Practitioner

## 2023-12-10 NOTE — Telephone Encounter (Signed)
 Please advise if we may schedule in any available slot or if we schedule and put on wait list?  See below from E2c2  Copied from CRM [id number]. Topic: Appointments - Scheduling Inquiry for Clinic >> Dec 10, 2023  2:50 PM [place] E wrote: Reason for CRM: Patient called in needing to schedule her physical before July 18th, and agent was only populating dates into November with PCP. Callback number for patient is [phone].

## 2023-12-10 NOTE — Telephone Encounter (Signed)
 Called pt. Scheduled her with [facility] for 12/31/23 @8 :20AM for physical to refill medication.

## 2023-12-30 DIAGNOSIS — Z01419 Encounter for gynecological examination (general) (routine) without abnormal findings: Secondary | ICD-10-CM | POA: Diagnosis not present

## 2023-12-30 DIAGNOSIS — Z1151 Encounter for screening for human papillomavirus (HPV): Secondary | ICD-10-CM | POA: Diagnosis not present

## 2023-12-30 DIAGNOSIS — Z124 Encounter for screening for malignant neoplasm of cervix: Secondary | ICD-10-CM | POA: Diagnosis not present

## 2023-12-30 DIAGNOSIS — Z6824 Body mass index (BMI) 24.0-24.9, adult: Secondary | ICD-10-CM | POA: Diagnosis not present

## 2023-12-31 ENCOUNTER — Ambulatory Visit (INDEPENDENT_AMBULATORY_CARE_PROVIDER_SITE_OTHER): Admitting: Nurse Practitioner

## 2023-12-31 ENCOUNTER — Encounter: Payer: Self-pay | Admitting: Nurse Practitioner

## 2023-12-31 VITALS — BP 110/62 | HR 67 | Temp 98.3°F | Ht 66.0 in | Wt 155.4 lb

## 2023-12-31 DIAGNOSIS — E7800 Pure hypercholesterolemia, unspecified: Secondary | ICD-10-CM | POA: Diagnosis not present

## 2023-12-31 DIAGNOSIS — K219 Gastro-esophageal reflux disease without esophagitis: Secondary | ICD-10-CM

## 2023-12-31 DIAGNOSIS — Z0000 Encounter for general adult medical examination without abnormal findings: Secondary | ICD-10-CM

## 2023-12-31 DIAGNOSIS — Z131 Encounter for screening for diabetes mellitus: Secondary | ICD-10-CM

## 2023-12-31 DIAGNOSIS — D509 Iron deficiency anemia, unspecified: Secondary | ICD-10-CM

## 2023-12-31 LAB — COMPREHENSIVE METABOLIC PANEL WITH GFR
ALT: 15 U/L (ref 0–35)
AST: 20 U/L (ref 0–37)
Albumin: 4.4 g/dL (ref 3.5–5.2)
Alkaline Phosphatase: 59 U/L (ref 39–117)
BUN: 15 mg/dL (ref 6–23)
CO2: 32 meq/L (ref 19–32)
Calcium: 9.3 mg/dL (ref 8.4–10.5)
Chloride: 101 meq/L (ref 96–112)
Creatinine, Ser: 0.89 mg/dL (ref 0.40–1.20)
GFR: 73.49 mL/min (ref >60.00)
Glucose, Bld: 93 mg/dL (ref 70–99)
Potassium: 4.6 meq/L (ref 3.5–5.1)
Sodium: 138 meq/L (ref 135–145)
Total Bilirubin: 1.2 mg/dL (ref 0.2–1.2)
Total Protein: 6.7 g/dL (ref 6.0–8.3)

## 2023-12-31 LAB — CBC WITH DIFFERENTIAL/PLATELET
Basophils Absolute: 0 K/uL (ref 0.0–0.1)
Basophils Relative: 1 % (ref 0.0–3.0)
Eosinophils Absolute: 0.3 K/uL (ref 0.0–0.7)
Eosinophils Relative: 7 % — ABNORMAL HIGH (ref 0.0–5.0)
HCT: 45.9 % (ref 36.0–46.0)
Hemoglobin: 15.2 g/dL — ABNORMAL HIGH (ref 12.0–15.0)
Lymphocytes Relative: 25.1 % (ref 12.0–46.0)
Lymphs Abs: 1 K/uL (ref 0.7–4.0)
MCHC: 33.1 g/dL (ref 30.0–36.0)
MCV: 94.6 fl (ref 78.0–100.0)
Monocytes Absolute: 0.6 K/uL (ref 0.1–1.0)
Monocytes Relative: 14.6 % — ABNORMAL HIGH (ref 3.0–12.0)
Neutro Abs: 2.2 K/uL (ref 1.4–7.7)
Neutrophils Relative %: 52.3 % (ref 43.0–77.0)
Platelets: 244 K/uL (ref 150.0–400.0)
RBC: 4.85 Mil/uL (ref 3.87–5.11)
RDW: 13.3 % (ref 11.5–15.5)
WBC: 4.1 K/uL (ref 4.0–10.5)

## 2023-12-31 LAB — LIPID PANEL
Cholesterol: 259 mg/dL — ABNORMAL HIGH (ref 0–200)
HDL: 80.9 mg/dL (ref >39.00)
LDL Cholesterol: 157 mg/dL — ABNORMAL HIGH (ref 0–99)
NonHDL: 178.19
Total CHOL/HDL Ratio: 3
Triglycerides: 107 mg/dL (ref 0.0–149.0)
VLDL: 21.4 mg/dL (ref 0.0–40.0)

## 2023-12-31 LAB — HEMOGLOBIN A1C: Hgb A1c MFr Bld: 6 % (ref 4.6–6.5)

## 2023-12-31 LAB — IBC + FERRITIN
Ferritin: 16.5 ng/mL (ref 10.0–291.0)
Iron: 121 ug/dL (ref 42–145)
Saturation Ratios: 34.3 % (ref 20.0–50.0)
TIBC: 352.8 ug/dL (ref 250.0–450.0)
Transferrin: 252 mg/dL (ref 212.0–360.0)

## 2023-12-31 LAB — TSH: TSH: 1.76 u[IU]/mL (ref 0.35–5.50)

## 2023-12-31 NOTE — Assessment & Plan Note (Signed)
 History of the same currently maintained on ferrous sulfate  325 mg thrice weekly.  Pending CBC and iron cascade

## 2023-12-31 NOTE — Progress Notes (Signed)
 Established Patient Office Visit  Subjective   Patient ID: Jo Cohen, female    DOB: 04-26-1971  Age: 53 y.o. MRN: [id number]  Chief Complaint  Patient presents with   Annual Exam    Pt had PAP smear done yesterday at GYN     HPI  GERD: Patient currently maintained on Protonix  40 mg daily. Works well. States that she does not change diet   IDA: Patient currently maintained on ferrous sulfate  325 mg thrice weekly  for complete physical and follow up of chronic conditions.  Immunizations: -Tetanus: refused  -Influenza: Out of season -Shingles:  discussed in office -Pneumonia: Too young  Diet: Fair diet. 3 meals a day. She will snack intermittently. She drinks water  Exercise:  She is doing 3 days a week a boot camp that is 45 mins. On the 4th day she will go the gym for lunch and walk daily   Eye exam: Completes annually . Glasses and contacts  Dental exam: Completes semi-annually    Colonoscopy: Completed in 10/25/2019, [facility], repeat 10 years.  Patient will be due in 2031 Lung Cancer Screening: N/A  Pap Smear: Dr. Marne, GYN.  Completed 12/30/2023  Mammogram: 11/25/2023, repeat 1 year  DEXA: too young followed by GYN  Sleep: going to bed around 11-12 and will get up aroud 5-6. Feels rested. Does not snore        Review of Systems  Constitutional:  Negative for chills and fever.  Respiratory:  Negative for shortness of breath.   Cardiovascular:  Negative for chest pain and leg swelling.  Gastrointestinal:  Negative for abdominal pain, blood in stool, constipation, diarrhea, nausea and vomiting.       Bm daily   Genitourinary:  Negative for dysuria and hematuria.  Neurological:  Negative for dizziness, tingling and headaches.  Psychiatric/Behavioral:  Negative for hallucinations and suicidal ideas.       Objective:     BP 110/62   Pulse 67   Temp 98.3 F (36.8 C) (Oral)   Ht 5' 6 (1.676 m)   Wt 155 lb 6.4 oz (70.5 kg)   LMP 12/30/2023  (Exact Date)   SpO2 99%   BMI 25.08 kg/m  BP Readings from Last 3 Encounters:  12/31/23 110/62  06/08/23 120/80  06/04/23 118/64   Wt Readings from Last 3 Encounters:  12/31/23 155 lb 6.4 oz (70.5 kg)  06/08/23 152 lb 4 oz (69.1 kg)  06/04/23 152 lb 8 oz (69.2 kg)   SpO2 Readings from Last 3 Encounters:  12/31/23 99%  06/08/23 99%  06/04/23 97%      Physical Exam Vitals and nursing note reviewed.  Constitutional:      Appearance: Normal appearance.  HENT:     Right Ear: Tympanic membrane, ear canal and external ear normal.     Left Ear: Tympanic membrane, ear canal and external ear normal.     Mouth/Throat:     Mouth: Mucous membranes are moist.     Pharynx: Oropharynx is clear.  Eyes:     Extraocular Movements: Extraocular movements intact.     Pupils: Pupils are equal, round, and reactive to light.  Cardiovascular:     Rate and Rhythm: Normal rate and regular rhythm.     Pulses: Normal pulses.     Heart sounds: Normal heart sounds.  Pulmonary:     Effort: Pulmonary effort is normal.     Breath sounds: Normal breath sounds.  Abdominal:     General:  Bowel sounds are normal. There is no distension.     Palpations: There is no mass.     Tenderness: There is no abdominal tenderness.     Hernia: No hernia is present.  Musculoskeletal:     Right lower leg: No edema.     Left lower leg: No edema.  Lymphadenopathy:     Cervical: No cervical adenopathy.  Skin:    General: Skin is warm.  Neurological:     General: No focal deficit present.     Mental Status: She is alert.     Deep Tendon Reflexes:     Reflex Scores:      Bicep reflexes are 2+ on the right side and 2+ on the left side.      Patellar reflexes are 2+ on the right side and 2+ on the left side.    Comments: Bilateral upper and lower extremity strength 5/5  Psychiatric:        Mood and Affect: Mood normal.        Behavior: Behavior normal.        Thought Content: Thought content normal.         Judgment: Judgment normal.      No results found for any visits on 12/31/23.    The 10-year ASCVD risk score (Arnett DK, et al., 2019) is: 1%    Assessment & Plan:   Problem List Items Addressed This Visit       Digestive   GERD (gastroesophageal reflux disease)   History of the same.  Patient currently maintained on Protonix  40 mg daily.  Continue        Other   Iron deficiency anemia   History of the same currently maintained on ferrous sulfate  325 mg thrice weekly.  Pending CBC and iron cascade      Relevant Orders   Comprehensive metabolic panel with GFR   IBC + Ferritin   CBC with Differential/Platelet   Preventative health care - Primary   Discussed age-appropriate immunizations and screening exams.  Did review patient's personal, surgical, social, family history is.  Patient is up-to-date on all age-appropriate vaccinations she would like.  Patient refused tetanus vaccine today.  We did discuss shingles vaccine today.  Patient's up-to-date on CRC screening, breast cancer screening, cervical cancer screening.  Patient was given information at discharge about preventative healthcare maintenance with anticipatory guidance.      Relevant Orders   Comprehensive metabolic panel with GFR   CBC with Differential/Platelet   TSH   Elevated LDL cholesterol level   History of the same pending lipid panel today.  Patient is eating appropriately and exercising adequately.      Relevant Orders   Lipid panel   Other Visit Diagnoses       Screening for diabetes mellitus       Relevant Orders   Hemoglobin A1c       Return in about 1 year (around 12/30/2024) for CPE and Labs.    Adina Crandall, NP

## 2023-12-31 NOTE — Assessment & Plan Note (Signed)
 Discussed age-appropriate immunizations and screening exams.  Did review patient's personal, surgical, social, family history is.  Patient is up-to-date on all age-appropriate vaccinations she would like.  Patient refused tetanus vaccine today.  We did discuss shingles vaccine today.  Patient's up-to-date on CRC screening, breast cancer screening, cervical cancer screening.  Patient was given information at discharge about preventative healthcare maintenance with anticipatory guidance.

## 2023-12-31 NOTE — Assessment & Plan Note (Signed)
 History of the same.  Patient currently maintained on Protonix  40 mg daily.  Continue

## 2023-12-31 NOTE — Assessment & Plan Note (Signed)
 History of the same pending lipid panel today.  Patient is eating appropriately and exercising adequately.

## 2024-01-02 ENCOUNTER — Other Ambulatory Visit: Payer: Self-pay | Admitting: Nurse Practitioner

## 2024-01-04 ENCOUNTER — Ambulatory Visit: Payer: Self-pay | Admitting: Nurse Practitioner

## 2024-01-04 DIAGNOSIS — R7303 Prediabetes: Secondary | ICD-10-CM | POA: Insufficient documentation

## 2024-01-04 DIAGNOSIS — E7800 Pure hypercholesterolemia, unspecified: Secondary | ICD-10-CM

## 2024-02-10 DIAGNOSIS — M25551 Pain in right hip: Secondary | ICD-10-CM | POA: Diagnosis not present

## 2024-04-13 DIAGNOSIS — M25551 Pain in right hip: Secondary | ICD-10-CM | POA: Diagnosis not present

## 2024-05-11 DIAGNOSIS — L570 Actinic keratosis: Secondary | ICD-10-CM | POA: Diagnosis not present

## 2024-05-11 DIAGNOSIS — L821 Other seborrheic keratosis: Secondary | ICD-10-CM | POA: Diagnosis not present

## 2024-05-11 DIAGNOSIS — L814 Other melanin hyperpigmentation: Secondary | ICD-10-CM | POA: Diagnosis not present

## 2024-05-11 DIAGNOSIS — L905 Scar conditions and fibrosis of skin: Secondary | ICD-10-CM | POA: Diagnosis not present

## 2024-07-08 ENCOUNTER — Other Ambulatory Visit: Payer: Self-pay | Admitting: Nurse Practitioner

## 2024-12-26 ENCOUNTER — Other Ambulatory Visit

## 2025-01-02 ENCOUNTER — Encounter: Admitting: Nurse Practitioner
